# Patient Record
Sex: Female | Born: 1995 | Race: White | Hispanic: No | Marital: Single | Smoking: Never smoker
Health system: Southern US, Community
[De-identification: ages and names within clinical notes are randomized; demographics above are authoritative.]

## PROBLEM LIST (undated history)

## (undated) DIAGNOSIS — F419 Anxiety disorder, unspecified: Secondary | ICD-10-CM

## (undated) DIAGNOSIS — K219 Gastro-esophageal reflux disease without esophagitis: Secondary | ICD-10-CM

## (undated) DIAGNOSIS — Z3041 Encounter for surveillance of contraceptive pills: Secondary | ICD-10-CM

## (undated) DIAGNOSIS — F4310 Post-traumatic stress disorder, unspecified: Secondary | ICD-10-CM

## (undated) DIAGNOSIS — F329 Major depressive disorder, single episode, unspecified: Secondary | ICD-10-CM

## (undated) DIAGNOSIS — E669 Obesity, unspecified: Secondary | ICD-10-CM

## (undated) HISTORY — DX: Obesity, unspecified: E66.9

## (undated) HISTORY — PX: WISDOM TOOTH EXTRACTION: SHX21

## (undated) HISTORY — DX: Encounter for surveillance of contraceptive pills: Z30.41

---

## 2014-06-26 ENCOUNTER — Encounter: Payer: Self-pay | Admitting: Family Medicine

## 2014-06-26 ENCOUNTER — Ambulatory Visit (INDEPENDENT_AMBULATORY_CARE_PROVIDER_SITE_OTHER): Payer: BLUE CROSS/BLUE SHIELD | Admitting: Family Medicine

## 2014-06-26 VITALS — BP 122/85 | HR 74 | Temp 98.4°F | Ht 64.25 in | Wt 171.0 lb

## 2014-06-26 DIAGNOSIS — Z01419 Encounter for gynecological examination (general) (routine) without abnormal findings: Secondary | ICD-10-CM

## 2014-06-26 DIAGNOSIS — Z3041 Encounter for surveillance of contraceptive pills: Secondary | ICD-10-CM

## 2014-06-26 DIAGNOSIS — Z0000 Encounter for general adult medical examination without abnormal findings: Secondary | ICD-10-CM | POA: Diagnosis not present

## 2014-06-26 MED ORDER — NORGESTIMATE-ETH ESTRADIOL 0.25-35 MG-MCG PO TABS: 1.0000 | ORAL_TABLET | Freq: Every day | ORAL | Status: DC

## 2014-06-26 NOTE — Progress Notes (Signed)
BP 122/85 mmHg  Pulse 74  Temp(Src) 98.4 F (36.9 C)  Ht 5' 4.25" (1.632 m)  Wt 171 lb (77.565 kg)  BMI 29.12 kg/m2  SpO2 100%  LMP 06/21/2014 (Exact Date)   Subjective:    Patient ID: Brittany Johns, female    DOB: 01-10-1996, 19 y.o.   MRN: [id number]  HPI: AUNESTY TYSON is a 19 y.o. female Chief Complaint  Patient presents with  . Annual Exam   She is feeling well overall, no current complaints She would like refill of her OCP; no break through bleeding; no migraines  Relevant past medical, surgical, family and social history reviewed and updated as indicated. Interim medical history since our last visit reviewed. Allergies and medications reviewed and updated.  Review of Systems  Constitutional: Negative for fever, chills, diaphoresis, fatigue and unexpected weight change.  HENT: Negative for dental problem, mouth sores, nosebleeds, sore throat and voice change.   Eyes: Negative for visual disturbance.  Respiratory: Negative for cough, choking and chest tightness.   Cardiovascular: Negative for chest pain, palpitations and leg swelling.  Gastrointestinal: Negative for abdominal pain, diarrhea, constipation, blood in stool and abdominal distention.  Endocrine: Negative for cold intolerance, heat intolerance, polydipsia and polyuria.  Genitourinary: Negative for dysuria, vaginal discharge and dyspareunia.  Musculoskeletal: Negative for myalgias and arthralgias.  Skin: Negative for color change, rash and wound.  Allergic/Immunologic: Negative for environmental allergies and food allergies.  Neurological: Negative for seizures, numbness and headaches.  Hematological: Does not bruise/bleed easily.  Psychiatric/Behavioral: Negative for dysphoric mood. The patient is not nervous/anxious.    Per HPI unless specifically indicated above     Objective:    BP 122/85 mmHg  Pulse 74  Temp(Src) 98.4 F (36.9 C)  Ht 5' 4.25" (1.632 m)  Wt 171 lb (77.565 kg)   BMI 29.12 kg/m2  SpO2 100%  LMP 06/21/2014 (Exact Date)  Wt Readings from Last 3 Encounters:  06/26/14 171 lb (77.565 kg) (92 %*, Z = 1.41)  06/23/13 155 lb (70.308 kg) (86 %*, Z = 1.08)  06/23/13 155 lb (70.308 kg) (86 %*, Z = 1.08)   * Growth percentiles are based on CDC 2-20 Years data.    Physical Exam  Constitutional: She appears well-developed and well-nourished. No distress.  HENT:  Head: Normocephalic and atraumatic.  Right Ear: External ear normal. Tympanic membrane is not injected, not scarred and not erythematous. No middle ear effusion. No decreased hearing is noted.  Left Ear: External ear normal. Tympanic membrane is not injected, not scarred and not erythematous.  No middle ear effusion. No decreased hearing is noted.  Nose: Nose normal.  Mouth/Throat: Oropharynx is clear and moist and mucous membranes are normal. Mucous membranes are not pale and not dry. No oral lesions. No dental caries.  Eyes: EOM are normal. Right eye exhibits no discharge. Left eye exhibits no discharge. No scleral icterus.  Neck: Normal range of motion. No JVD present. No thyromegaly present.  Cardiovascular: Normal rate, regular rhythm and normal heart sounds.  Exam reveals no gallop.   No murmur heard. Pulmonary/Chest: Effort normal and breath sounds normal. No respiratory distress. She has no wheezes. She has no rales.  Abdominal: Soft. Bowel sounds are normal. She exhibits no distension and no mass. There is no tenderness.  Musculoskeletal: Normal range of motion. She exhibits no edema or tenderness.  Lymphadenopathy:    She has no cervical adenopathy.  Neurological: She is alert. She displays normal reflexes. She exhibits normal  muscle tone.  Skin: Skin is warm and dry. No rash noted. She is not diaphoretic. No erythema. No pallor.  Psychiatric: She has a normal mood and affect. Her behavior is normal. Judgment and thought content normal.  Nursing note and vitals reviewed.   No results  found for this or any previous visit.    Assessment & Plan:   Problem List Items Addressed This Visit    None    Visit Diagnoses    Well woman exam    -  Primary    healthy living, age-appropriate guidance and counseling; she refused HIV testing; tetanus UTD    Relevant Orders    Lipid Profile    Comp Met (CMET)    GC/Chlamydia Probe Amp    Oral contraceptive use        non-smoker; no migraines; she is aware of risks; refills provided; pap smear at age 10        Follow up plan: Return in about 1 year (around 06/26/2015) for physical.   Note: after labs drawn, patient became presyncopal at the check-out window; vitals monitored; drink/snack given; recovered well; will have her let lab techs know next time to be prepared for next venipuncture

## 2014-06-26 NOTE — Patient Instructions (Addendum)
Tetanus booster was received June 21, 2012 per our older records Physical activity like yoga and walking encouraged Healthy eating (3 servings of calcium daily, diet low in saturated fats, 5 servings of fruits and veggies a day, etc.)   Health Maintenance - 31-20 Years Old SCHOOL PERFORMANCE After high school, you may attend college or technical or vocational school, enroll in the [employer], or enter the workforce. PHYSICAL, SOCIAL, AND EMOTIONAL DEVELOPMENT  One hour of regular physical activity daily is recommended. Continue to participate in sports.  Develop your own interests and consider community service or volunteerism.  Make decisions about college and work plans.  Throughout these years, you should assume responsibility for your own health care. Increasing independence is important for you.  You may be exploring your sexual identity. Understand that you should never be in a situation that makes you feel uncomfortable, and tell your partner if you do not want to engage in sexual activity.  Body image may become important to you. Be mindful that eating disorders can develop at this time. Talk to your parents or other caregivers if you have concerns about body image, weight gain, or losing weight.  You may notice mood disturbances, depression, anxiety, attention problems, or trouble with alcohol. Talk to your health care provider if you have concerns about mental illness.  Set limits for yourself and talk with your parents or other caregivers about independent decision making.  Handle conflict without physical violence.  Avoid loud noises which may impair hearing.  Limit television and computer time to 2 hours each day. Individuals who engage in excessive inactivity are more likely to become overweight. RECOMMENDED IMMUNIZATIONS  Influenza vaccine.  All adults should be immunized every year.  All adults, including pregnant women and people with hives-only allergy to eggs, can  receive the inactivated influenza (IIV) vaccine.  Adults aged 18-49 years can receive the recombinant influenza (RIV) vaccine. The RIV vaccine does not contain any egg protein.  Tetanus, diphtheria, and acellular pertussis (Td, Tdap) vaccine.  Pregnant women should receive 1 dose of Tdap vaccine during each pregnancy. The dose should be obtained regardless of the length of time since the last dose. Immunization is preferred during the 27th to 36th week of gestation.  An adult who has not previously received Tdap or who does not know his or her vaccine status should receive 1 dose of Tdap. This initial dose should be followed by tetanus and diphtheria toxoids (Td) booster doses every 10 years.  Adults with an unknown or incomplete history of completing a 3-dose immunization series with Td-containing vaccines should begin or complete a primary immunization series including a Tdap dose.  Adults should receive a Td booster every 10 years.  Varicella vaccine.  An adult without evidence of immunity to varicella should receive 2 doses or a second dose if he or she has previously received 1 dose.  Pregnant females who do not have evidence of immunity should receive the first dose after pregnancy. This first dose should be obtained before leaving the health care facility. The second dose should be obtained 4-8 weeks after the first dose.  Human papillomavirus (HPV) vaccine.  Females aged 13-26 years who have not received the vaccine previously should obtain the 3-dose series.  The vaccine is not recommended for pregnant females. However, pregnancy testing is not needed before receiving a dose. If a female is found to be pregnant after receiving a dose, no treatment is needed. In that case, the remaining doses should be  delayed until after the pregnancy.  Males aged 69-21 years who have not received the vaccine previously should receive the 3-dose series. Males aged 22-26 years may be  immunized.  Immunization is recommended through the age of 29 years for any female who has sex with males and did not get any or all doses earlier.  Immunization is recommended for any person with an immunocompromised condition through the age of 46 years if he or she did not get any or all doses earlier.  During the 3-dose series, the second dose should be obtained 4-8 weeks after the first dose. The third dose should be obtained 24 weeks after the first dose and 16 weeks after the second dose.  Measles, mumps, and rubella (MMR) vaccine.  Adults born in [birth year] or later should have 1 or more doses of MMR vaccine unless there is a contraindication to the vaccine or there is laboratory evidence of immunity to each of the three diseases.  A routine second dose of MMR vaccine should be obtained at least 28 days after the first dose for students attending postsecondary schools, health care workers, and international travelers.  For females of childbearing age, rubella immunity should be determined. If there is no evidence of immunity, females who are not pregnant should be vaccinated. If there is no evidence of immunity, females who are pregnant should delay immunization until after pregnancy.  Pneumococcal 13-valent conjugate (PCV13) vaccine.  When indicated, a person who is uncertain of his or her immunization history and has no record of immunization should receive the PCV13 vaccine.  An adult aged 52 years or older who has certain medical conditions and has not been previously immunized should receive 1 dose of PCV13 vaccine. This PCV13 should be followed with a dose of pneumococcal polysaccharide (PPSV23) vaccine. The PPSV23 vaccine dose should be obtained at least 8 weeks after the dose of PCV13 vaccine.  An adult aged 24 years or older who has certain medical conditions and previously received 1 or more doses of PPSV23 vaccine should receive 1 dose of PCV13. The PCV13 vaccine dose should be  obtained 1 or more years after the last PPSV23 vaccine dose.  Pneumococcal polysaccharide (PPSV23) vaccine.  When PCV13 is also indicated, PCV13 should be obtained first.  An adult younger than age 66 years who has certain medical conditions should be immunized.  Any person who resides in a long-term care facility should be immunized.  An adult smoker should be immunized.  People with an immunocompromised condition and certain other conditions should receive both PCV13 and PPSV23 vaccines.  People with human immunodeficiency virus (HIV) infection should be immunized as soon as possible after diagnosis.  Immunization during chemotherapy or radiation therapy should be avoided.  Routine use of PPSV23 vaccine is not recommended for American Indians, [place], or people younger than 65 years unless there are medical conditions that require PPSV23 vaccine.  When indicated, people who have unknown immunization and have no record of immunization should receive PPSV23 vaccine.  One-time revaccination 5 years after the first dose of PPSV23 is recommended for people aged 19-64 years who have chronic kidney failure, nephrotic syndrome, asplenia, or immunocompromised conditions.  Meningococcal vaccine.  Adults with asplenia or persistent complement component deficiencies should receive 2 doses of quadrivalent meningococcal conjugate (MenACWY-D) vaccine. The doses should be obtained at least 2 months apart.  Microbiologists working with certain meningococcal bacteria, [place] recruits, people at risk during an outbreak, and people who travel to or live in  countries with a high rate of meningitis should be immunized.  A first-year college student up through age 35 years who is living in a residence hall should receive a dose if he or she did not receive a dose on or after his or her 16th birthday.  Adults who have certain high-risk conditions should receive one or more doses of  vaccine.  Hepatitis A vaccine.  Adults who wish to be protected from this disease, have certain high-risk conditions, work with hepatitis A-infected animals, work in hepatitis A research labs, or travel to or work in countries with a high rate of hepatitis A should be immunized.  Adults who were previously unvaccinated and who anticipate close contact with an international adoptee during the first 60 days after arrival in the [place] from a country with a high rate of hepatitis A should be immunized.  Hepatitis B vaccine.  Adults who wish to be protected from this disease, have certain high-risk conditions, may be exposed to blood or other infectious body fluids, are household contacts or sex partners of hepatitis B positive people, are clients or workers in certain care facilities, or travel to or work in countries with a high rate of hepatitis B should be immunized.  Haemophilus influenzae type b (Hib) vaccine.  A previously unvaccinated person with asplenia or sickle cell disease or having a scheduled splenectomy should receive 1 dose of Hib vaccine.  Regardless of previous immunization, a recipient of a hematopoietic stem cell transplant should receive a 3-dose series 6-12 months after his or her successful transplant.  Hib vaccine is not recommended for adults with HIV infection. TESTING  Annual screening for vision and hearing problems is recommended. Vision should be screened at least once between 62-31 years of age.  You may be screened for anemia or tuberculosis.  You should have a blood test to check for high cholesterol.  You should be screened for alcohol and drug use.  If you are sexually active, you may be screened for sexually transmitted infections (STIs), pregnancy, or HIV. You should be screened for STIs if:  Your sexual activity has changed since the last screening test, and you are at an increased risk for chlamydia or gonorrhea. Ask your health care provider  if you are at risk.  If you are at an increased risk for hepatitis B, you should be screened for this virus. You are considered at high risk for hepatitis B if you:  Were born in a country where hepatitis B occurs often. Talk with your health care provider about which countries are considered high risk.  Have parents who were born in a high-risk country and have not received a shot to protect against hepatitis B (hepatitis B vaccine).  Have HIV or AIDS.  Use needles to inject street drugs.  Live with or have sex with someone who has hepatitis B.  Are a man who has sex with other men (MSM).  Get hemodialysis treatment.  Take certain medicines for conditions like cancer, organ transplantation, or autoimmune conditions. NUTRITION   You should:  Have three servings of low-fat milk and dairy products daily. If you do not drink milk or consume dairy products, you should eat calcium-enriched foods, such as juice, bread, or cereal. Dark, leafy greens or canned fish are alternate sources of calcium.  Drink plenty of water. Fruit juice should be limited to 8-12 oz (240-360 mL) each day. Sugary beverages and sodas should be avoided.  Avoid eating foods high in fat,  salt, or sugar, such as chips, candy, and cookies.  Avoid fast foods and limit eating out at restaurants.  Try not to skip meals, especially breakfast. You should eat a variety of vegetables, fruits, and lean meats.  Eat meals together as a family whenever possible. ORAL HEALTH Brush your teeth twice a day and floss at least once a day. You should have two dental exams a year.  SKIN CARE You should wear sunscreen when out in the sun. TALK TO SOMEONE ABOUT:  Precautions against pregnancy, contraception, and sexually transmitted infections.  Taking a prescription medicine daily to prevent HIV infection if you are at risk of being infected with HIV. This is called preexposure prophylaxis (PrEP). You are at risk if you:  Are a  female who has sex with other males (MSM).  Are heterosexual and sexually active with more than one partner.  Take drugs by injection.  Are sexually active with a partner who has HIV.  Whether you are at high risk of being infected with HIV. If you choose to begin PrEP, you should first be tested for HIV. You should then be tested every 3 months for as long as you are taking PrEP.  Drug, tobacco, and alcohol use among your friends or at friends' homes. Smoking tobacco or marijuana and taking drugs have health consequences and may impact your brain development.  Appropriate use of over-the-counter or prescription medicines.  Driving guidelines and riding with friends.  The risks of drinking and driving or boating. Call someone if you have been drinking or using drugs and need a ride. WHAT'S NEXT? Visit your pediatrician or family physician once a year. By young adulthood, you should transition from your pediatrician to a family physician or internal medicine specialist. If you are a female and are sexually active, you may want to begin annual physical exams with a gynecologist. Document Released: 03/30/2006 Document Revised: 01/07/2013 Document Reviewed: 04/19/2006 [facility] Patient Information 2015 [place], [place]. This information is not intended to replace advice given to you by your health care provider. Make sure you discuss any questions you have with your health care provider.

## 2014-06-27 LAB — COMPREHENSIVE METABOLIC PANEL
ALT: 8 IU/L (ref 0–32)
AST: 12 IU/L (ref 0–40)
Albumin/Globulin Ratio: 1.8 (ref 1.1–2.5)
Albumin: 4.8 g/dL (ref 3.5–5.5)
Alkaline Phosphatase: 48 IU/L (ref 39–117)
BUN/Creatinine Ratio: 14 (ref 8–20)
BUN: 11 mg/dL (ref 6–20)
Bilirubin Total: 0.5 mg/dL (ref 0.0–1.2)
CALCIUM: 10 mg/dL (ref 8.7–10.2)
CHLORIDE: 99 mmol/L (ref 97–108)
CO2: 24 mmol/L (ref 18–29)
Creatinine, Ser: 0.81 mg/dL (ref 0.57–1.00)
GFR calc Af Amer: 122 mL/min/{1.73_m2} (ref >59)
GFR, EST NON AFRICAN AMERICAN: 106 mL/min/{1.73_m2} (ref >59)
Globulin, Total: 2.6 g/dL (ref 1.5–4.5)
Glucose: 82 mg/dL (ref 65–99)
Potassium: 4.5 mmol/L (ref 3.5–5.2)
SODIUM: 139 mmol/L (ref 134–144)
Total Protein: 7.4 g/dL (ref 6.0–8.5)

## 2014-06-27 LAB — LIPID PANEL
CHOL/HDL RATIO: 3 ratio (ref 0.0–4.4)
CHOLESTEROL TOTAL: 179 mg/dL — AB (ref 100–169)
HDL: 59 mg/dL (ref >39)
LDL Calculated: 109 mg/dL (ref 0–109)
Triglycerides: 55 mg/dL (ref 0–89)
VLDL Cholesterol Cal: 11 mg/dL (ref 5–40)

## 2014-06-29 ENCOUNTER — Encounter: Payer: Self-pay | Admitting: Family Medicine

## 2014-06-29 LAB — GC/CHLAMYDIA PROBE AMP
Chlamydia trachomatis, NAA: NEGATIVE
NEISSERIA GONORRHOEAE BY PCR: NEGATIVE

## 2015-01-07 ENCOUNTER — Encounter: Payer: Self-pay | Admitting: Family Medicine

## 2015-01-07 ENCOUNTER — Ambulatory Visit (INDEPENDENT_AMBULATORY_CARE_PROVIDER_SITE_OTHER): Payer: BLUE CROSS/BLUE SHIELD | Admitting: Family Medicine

## 2015-01-07 VITALS — BP 106/72 | HR 82 | Resp 16 | Wt 173.4 lb

## 2015-01-07 DIAGNOSIS — T7840XA Allergy, unspecified, initial encounter: Secondary | ICD-10-CM

## 2015-01-07 MED ORDER — DIPHENHYDRAMINE HCL 25 MG PO CAPS: 25.0000 mg | ORAL_CAPSULE | Freq: Four times a day (QID) | ORAL | Status: DC | PRN

## 2015-01-07 NOTE — Progress Notes (Signed)
Name: Brittany BuddCatherine M Johns   MRN: [id number]    DOB: 10/19/1995   Date:01/07/2015       Progress Note  Subjective  Chief Complaint  Chief Complaint  Patient presents with  . Rash    Taking Hydrocodone post dental work. Rash started 3 days ago.     HPI Had 4 wisdom teeth removed 12/31/14.  Took Amoxil until yesterday.  Still taking Norco.  Rash started 2 days ago.  Mild.  Has gotten very bad past 12 hrs.  Itching pretty bad.  No treatment.  No problem-specific assessment & plan notes found for this encounter.   History reviewed. No pertinent past medical history.  Social History  Substance Use Topics  . Smoking status: Never Smoker   . Smokeless tobacco: Never Used  . Alcohol Use: No     Current outpatient prescriptions:  .  chlorhexidine (PERIDEX) 0.12 % solution, , Disp: , Rfl: 98 .  HYDROcodone-acetaminophen (NORCO) 7.5-325 MG tablet, , Disp: , Rfl: 0 .  ibuprofen (ADVIL,MOTRIN) 600 MG tablet, , Disp: , Rfl: 0 .  norgestimate-ethinyl estradiol (ORTHO-CYCLEN,SPRINTEC,PREVIFEM) 0.25-35 MG-MCG tablet, Take 1 tablet by mouth daily., Disp: 1 Package, Rfl: 12  No Known Allergies  Review of Systems  Constitutional: Negative for fever, chills, weight loss and malaise/fatigue.  HENT: Negative for hearing loss.   Eyes: Negative for blurred vision and double vision.  Respiratory: Negative for cough, shortness of breath and wheezing.   Cardiovascular: Negative for chest pain, palpitations and leg swelling.  Gastrointestinal: Negative for heartburn, abdominal pain and blood in stool.  Genitourinary: Negative for dysuria, urgency and frequency.  Skin: Positive for itching and rash.  Neurological: Negative for dizziness, tremors, weakness and headaches.      Objective  Filed Vitals:   01/07/15 1541  BP: 106/72  Pulse: 82  Resp: 16  Weight: 173 lb 6.4 oz (78.654 kg)     Physical Exam  Constitutional: She is oriented to person, place, and time and well-developed,  well-nourished, and in no distress. No distress.  HENT:  Head: Normocephalic and atraumatic.  Cardiovascular: Normal rate, regular rhythm and normal heart sounds.  Exam reveals no gallop and no friction rub.   No murmur heard. Pulmonary/Chest: Effort normal and breath sounds normal. No respiratory distress. She has no wheezes. She has no rales.  Neurological: She is alert and oriented to person, place, and time.  Skin:  Maculo -papular, urticarial rash diffusely over body.  None in  mouth      No results found for this or any previous visit (from the past 2160 hour(s)).   Assessment & Plan  1. Allergic reaction, initial encounter -Take Benadryl  25 mg. Up to 4 times a day.

## 2015-06-17 ENCOUNTER — Other Ambulatory Visit: Payer: Self-pay | Admitting: Family Medicine

## 2015-06-23 ENCOUNTER — Other Ambulatory Visit: Payer: Self-pay | Admitting: Family Medicine

## 2015-06-23 NOTE — Telephone Encounter (Signed)
rx approved Upcoming appt next week

## 2015-06-28 ENCOUNTER — Encounter: Payer: BLUE CROSS/BLUE SHIELD | Admitting: Family Medicine

## 2015-07-13 ENCOUNTER — Encounter: Payer: Self-pay | Admitting: Family Medicine

## 2015-07-13 ENCOUNTER — Ambulatory Visit (INDEPENDENT_AMBULATORY_CARE_PROVIDER_SITE_OTHER): Payer: BLUE CROSS/BLUE SHIELD | Admitting: Family Medicine

## 2015-07-13 VITALS — BP 114/72 | HR 95 | Temp 98.7°F | Resp 16 | Wt 182.0 lb

## 2015-07-13 DIAGNOSIS — Z3041 Encounter for surveillance of contraceptive pills: Secondary | ICD-10-CM | POA: Diagnosis not present

## 2015-07-13 DIAGNOSIS — Z0000 Encounter for general adult medical examination without abnormal findings: Secondary | ICD-10-CM | POA: Diagnosis not present

## 2015-07-13 DIAGNOSIS — E669 Obesity, unspecified: Secondary | ICD-10-CM | POA: Diagnosis not present

## 2015-07-13 HISTORY — DX: Obesity, unspecified: E66.9

## 2015-07-13 HISTORY — DX: Encounter for surveillance of contraceptive pills: Z30.41

## 2015-07-13 MED ORDER — NORGESTIMATE-ETH ESTRADIOL 0.25-35 MG-MCG PO TABS: 1.0000 | ORAL_TABLET | Freq: Every day | ORAL | Status: DC

## 2015-07-13 NOTE — Progress Notes (Signed)
Patient ID: Brittany Johns, female   DOB: 05/11/1995, 20 y.o.   MRN: [id number]   Subjective:   Brittany Johns is a 20 y.o. female here for a complete physical exam  Interim issues since last visit: allergic to penicillin, had teeth pulled and got a diffuse rash  USPSTF grade A and B recommendations Alcohol: no Depression: Depression screen [facility] 2/9 07/13/2015  Decreased Interest 0  Down, Depressed, Hopeless 0  PHQ - 2 Score 0    Hypertension: well-controlled, does not run in the family Obesity: wondering about her thyroid, not as active, weight gain doesn't make sense Tobacco use: no HIV: declined STD testing and prevention (chl/gon/syphilis): decline Lipids: check another day Glucose: check another day Colorectal cancer: no colon cancer Breast cancer: no fam hx BRCA gene screening: no fam hx of breast or ovarian cancer Intimate partner violence: no Cervical cancer screening: start at 76 Diet: tries to eat pretty good Exercise: try to become active Skin cancer: no worrisome moles  On OCP; happy with those; using for contraception and regulating period; no migraines with aura; no DVT  Past Medical History  Diagnosis Date  . Encounter for birth control pills maintenance 07/13/2015  . Obesity 07/13/2015     Past Surgical History  Procedure Laterality Date  . Wisdom tooth extraction     Family History  Problem Relation Age of Onset  . Diabetes Maternal Grandmother     BORDERLINE  . Heart disease Maternal Grandfather   . Heart disease Paternal Grandfather    Social History  Substance Use Topics  . Smoking status: Never Smoker   . Smokeless tobacco: Never Used  . Alcohol Use: No   Review of Systems  Constitutional: Positive for unexpected weight change.  Eyes: Negative for visual disturbance.  Respiratory: Negative for wheezing.   Cardiovascular: Negative for chest pain.  Gastrointestinal: Negative for blood in stool.  Genitourinary: Negative for  hematuria and pelvic pain.  Hematological: Negative for adenopathy. Does not bruise/bleed easily.  Psychiatric/Behavioral: Negative for dysphoric mood.    Objective:   Filed Vitals:   07/13/15 1121  BP: 114/72  Pulse: 95  Temp: 98.7 F (37.1 C)  TempSrc: Oral  Resp: 16  Weight: 182 lb (82.555 kg)  SpO2: 98%   Body mass index is 31 kg/(m^2). Wt Readings from Last 3 Encounters:  07/13/15 182 lb (82.555 kg)  01/07/15 173 lb 6.4 oz (78.654 kg)  06/26/14 171 lb (77.565 kg) (92 %*, Z = 1.41)   * Growth percentiles are based on CDC 2-20 Years data.   Physical Exam  Constitutional: She appears well-developed and well-nourished.  HENT:  Head: Normocephalic and atraumatic.  Right Ear: Hearing, tympanic membrane, external ear and ear canal normal.  Left Ear: Hearing, tympanic membrane, external ear and ear canal normal.  Eyes: Conjunctivae and EOM are normal. Right eye exhibits no hordeolum. Left eye exhibits no hordeolum. No scleral icterus.  Neck: Carotid bruit is not present. No thyromegaly present.  Cardiovascular: Normal rate, regular rhythm, S1 normal, S2 normal and normal heart sounds.   No extrasystoles are present.  Pulmonary/Chest: Effort normal and breath sounds normal. No respiratory distress.  Abdominal: Soft. Normal appearance and bowel sounds are normal. She exhibits no distension, no abdominal bruit, no pulsatile midline mass and no mass. There is no hepatosplenomegaly. There is no tenderness. No hernia.  Musculoskeletal: Normal range of motion. She exhibits no edema.  Lymphadenopathy:       Head (right side): No submandibular adenopathy  present.       Head (left side): No submandibular adenopathy present.    She has no cervical adenopathy.  Neurological: She is alert. She displays no tremor. No cranial nerve deficit. She exhibits normal muscle tone. Gait normal.  Reflex Scores:      Patellar reflexes are 2+ on the right side and 2+ on the left side. Skin: Skin is  warm and dry. No bruising and no ecchymosis noted. No cyanosis. No pallor.  Psychiatric: Her speech is normal and behavior is normal. Thought content normal. Her mood appears not anxious. She does not exhibit a depressed mood.   Assessment/Plan:   Problem List Items Addressed This Visit      Other   Preventative health care - Primary    USPSTF grade A and B recommendations reviewed with patient; age-appropriate recommendations, preventive care, screening tests, etc discussed and encouraged; healthy living encouraged; see AVS for patient education given to patient      Relevant Orders   COMPLETE METABOLIC PANEL WITH GFR   TSH   Lipid panel   CBC with Differential/Platelet   Obesity    Encouraged healthy eating, more activity; see AVS for recommendations; will check TSH to ensure not underactive      Encounter for birth control pills maintenance    Denies smoking, denies migraine with aura, denies DVT; BP controlled; okay to continue OCPs         Meds ordered this encounter  Medications  . norgestimate-ethinyl estradiol (SPRINTEC 28) 0.25-35 MG-MCG tablet    Sig: Take 1 tablet by mouth daily.    Dispense:  1 Package    Refill:  12   Orders Placed This Encounter  Procedures  . COMPLETE METABOLIC PANEL WITH GFR    Standing Status: Future     Number of Occurrences:      Standing Expiration Date: 10/16/2015  . TSH    Standing Status: Future     Number of Occurrences:      Standing Expiration Date: 10/16/2015  . Lipid panel    Standing Status: Future     Number of Occurrences:      Standing Expiration Date: 10/16/2015    Order Specific Question:  Has the patient fasted?    Answer:  Yes  . CBC with Differential/Platelet    Standing Status: Future     Number of Occurrences:      Standing Expiration Date: 10/16/2015   Follow up plan: Return in about 1 year (around 07/12/2016) for complete physical. An after-visit summary was printed and given to the patient at [facility].   Please see the patient instructions which may contain other information and recommendations beyond what is mentioned above in the assessment and plan.

## 2015-07-13 NOTE — Patient Instructions (Addendum)
Please return for fasting labs; okay to drink calorie-free liquids, but no calories after midnight the night before your come  Check out the information at familydoctor.org entitled "Nutrition for Weight Loss: What You Need to Know about Fad Diets" Try to lose between 1-2 pounds per week by taking in fewer calories and burning off more calories You can succeed by limiting portions, limiting foods dense in calories and fat, becoming more active, and drinking 8 glasses of water a day (64 ounces) Don't skip meals, especially breakfast, as skipping meals may alter your metabolism Do not use over-the-counter weight loss pills or gimmicks that claim rapid weight loss A healthy BMI (or body mass index) is between 18.5 and 24.9 You can calculate your ideal BMI at the [place] website [employer]fr   Health Maintenance, Female Adopting a healthy lifestyle and getting preventive care can go a long way to promote health and wellness. Talk with your health care provider about what schedule of regular examinations is right for you. This is a good chance for you to check in with your provider about disease prevention and staying healthy. In between checkups, there are plenty of things you can do on your own. Experts have done a lot of research about which lifestyle changes and preventive measures are most likely to keep you healthy. Ask your health care provider for more information. WEIGHT AND DIET  Eat a healthy diet  Be sure to include plenty of vegetables, fruits, low-fat dairy products, and lean protein.  Do not eat a lot of foods high in solid fats, added sugars, or salt.  Get regular exercise. This is one of the most important things you can do for your health.  Most adults should exercise for at least 150 minutes each week. The exercise should increase your heart rate and make you sweat (moderate-intensity exercise).  Most adults should also do  strengthening exercises at least twice a week. This is in addition to the moderate-intensity exercise.  Maintain a healthy weight  Body mass index (BMI) is a measurement that can be used to identify possible weight problems. It estimates body fat based on height and weight. Your health care provider can help determine your BMI and help you achieve or maintain a healthy weight.  For females 38 years of age and older:   A BMI below 18.5 is considered underweight.  A BMI of 18.5 to 24.9 is normal.  A BMI of 25 to 29.9 is considered overweight.  A BMI of 30 and above is considered obese.  Watch levels of cholesterol and blood lipids  You should start having your blood tested for lipids and cholesterol at 21 years of age, then have this test every 5 years.  You may need to have your cholesterol levels checked more often if:  Your lipid or cholesterol levels are high.  You are older than 21 years of age.  You are at high risk for heart disease.  CANCER SCREENING   Lung Cancer  Lung cancer screening is recommended for adults 57-1 years old who are at high risk for lung cancer because of a history of smoking.  A yearly low-dose CT scan of the lungs is recommended for people who:  Currently smoke.  Have quit within the past 15 years.  Have at least a 30-pack-year history of smoking. A pack year is smoking an average of one pack of cigarettes a day for 1 year.  Yearly screening should continue until it has been 15 years since  you quit.  Yearly screening should stop if you develop a health problem that would prevent you from having lung cancer treatment.  Breast Cancer  Practice breast self-awareness. This means understanding how your breasts normally appear and feel.  It also means doing regular breast self-exams. Let your health care provider know about any changes, no matter how small.  If you are in your 20s or 30s, you should have a clinical breast exam (CBE) by a  health care provider every 1-3 years as part of a regular health exam.  If you are 64 or older, have a CBE every year. Also consider having a breast X-ray (mammogram) every year.  If you have a family history of breast cancer, talk to your health care provider about genetic screening.  If you are at high risk for breast cancer, talk to your health care provider about having an MRI and a mammogram every year.  Breast cancer gene (BRCA) assessment is recommended for women who have family members with BRCA-related cancers. BRCA-related cancers include:  Breast.  Ovarian.  Tubal.  Peritoneal cancers.  Results of the assessment will determine the need for genetic counseling and BRCA1 and BRCA2 testing. Cervical Cancer Your health care provider may recommend that you be screened regularly for cancer of the pelvic organs (ovaries, uterus, and vagina). This screening involves a pelvic examination, including checking for microscopic changes to the surface of your cervix (Pap test). You may be encouraged to have this screening done every 3 years, beginning at age 51.  For women ages 13-65, health care providers may recommend pelvic exams and Pap testing every 3 years, or they may recommend the Pap and pelvic exam, combined with testing for human papilloma virus (HPV), every 5 years. Some types of HPV increase your risk of cervical cancer. Testing for HPV may also be done on women of any age with unclear Pap test results.  Other health care providers may not recommend any screening for nonpregnant women who are considered low risk for pelvic cancer and who do not have symptoms. Ask your health care provider if a screening pelvic exam is right for you.  If you have had past treatment for cervical cancer or a condition that could lead to cancer, you need Pap tests and screening for cancer for at least 20 years after your treatment. If Pap tests have been discontinued, your risk factors (such as having a  new sexual partner) need to be reassessed to determine if screening should resume. Some women have medical problems that increase the chance of getting cervical cancer. In these cases, your health care provider may recommend more frequent screening and Pap tests. Colorectal Cancer  This type of cancer can be detected and often prevented.  Routine colorectal cancer screening usually begins at 21 years of age and continues through 21 years of age.  Your health care provider may recommend screening at an earlier age if you have risk factors for colon cancer.  Your health care provider may also recommend using home test kits to check for hidden blood in the stool.  A small camera at the end of a tube can be used to examine your colon directly (sigmoidoscopy or colonoscopy). This is done to check for the earliest forms of colorectal cancer.  Routine screening usually begins at age 58.  Direct examination of the colon should be repeated every 5-10 years through 21 years of age. However, you may need to be screened more often if early forms of  precancerous polyps or small growths are found. Skin Cancer  Check your skin from head to toe regularly.  Tell your health care provider about any new moles or changes in moles, especially if there is a change in a mole's shape or color.  Also tell your health care provider if you have a mole that is larger than the size of a pencil eraser.  Always use sunscreen. Apply sunscreen liberally and repeatedly throughout the day.  Protect yourself by wearing long sleeves, pants, a wide-brimmed hat, and sunglasses whenever you are outside. HEART DISEASE, DIABETES, AND HIGH BLOOD PRESSURE   High blood pressure causes heart disease and increases the risk of stroke. High blood pressure is more likely to develop in:  People who have blood pressure in the high end of the normal range (130-139/85-89 mm Hg).  People who are overweight or obese.  People who are  African American.  If you are 23-85 years of age, have your blood pressure checked every 3-5 years. If you are 42 years of age or older, have your blood pressure checked every year. You should have your blood pressure measured twice--once when you are at a hospital or clinic, and once when you are not at a hospital or clinic. Record the average of the two measurements. To check your blood pressure when you are not at a hospital or clinic, you can use:  An automated blood pressure machine at a pharmacy.  A home blood pressure monitor.  If you are between 34 years and 84 years old, ask your health care provider if you should take aspirin to prevent strokes.  Have regular diabetes screenings. This involves taking a blood sample to check your fasting blood sugar level.  If you are at a normal weight and have a low risk for diabetes, have this test once every three years after 21 years of age.  If you are overweight and have a high risk for diabetes, consider being tested at a younger age or more often. PREVENTING INFECTION  Hepatitis B  If you have a higher risk for hepatitis B, you should be screened for this virus. You are considered at high risk for hepatitis B if:  You were born in a country where hepatitis B is common. Ask your health care provider which countries are considered high risk.  Your parents were born in a high-risk country, and you have not been immunized against hepatitis B (hepatitis B vaccine).  You have HIV or AIDS.  You use needles to inject street drugs.  You live with someone who has hepatitis B.  You have had sex with someone who has hepatitis B.  You get hemodialysis treatment.  You take certain medicines for conditions, including cancer, organ transplantation, and autoimmune conditions. Hepatitis C  Blood testing is recommended for:  Everyone born from [birth year] through 1966.  Anyone with known risk factors for hepatitis C. Sexually transmitted infections  (STIs)  You should be screened for sexually transmitted infections (STIs) including gonorrhea and chlamydia if:  You are sexually active and are younger than 21 years of age.  You are older than 21 years of age and your health care provider tells you that you are at risk for this type of infection.  Your sexual activity has changed since you were last screened and you are at an increased risk for chlamydia or gonorrhea. Ask your health care provider if you are at risk.  If you do not have HIV, but are at risk,  it may be recommended that you take a prescription medicine daily to prevent HIV infection. This is called pre-exposure prophylaxis (PrEP). You are considered at risk if:  You are sexually active and do not regularly use condoms or know the HIV status of your partner(s).  You take drugs by injection.  You are sexually active with a partner who has HIV. Talk with your health care provider about whether you are at high risk of being infected with HIV. If you choose to begin PrEP, you should first be tested for HIV. You should then be tested every 3 months for as long as you are taking PrEP.  PREGNANCY   If you are premenopausal and you may become pregnant, ask your health care provider about preconception counseling.  If you may become pregnant, take 400 to 800 micrograms (mcg) of folic acid every day.  If you want to prevent pregnancy, talk to your health care provider about birth control (contraception). OSTEOPOROSIS AND MENOPAUSE   Osteoporosis is a disease in which the bones lose minerals and strength with aging. This can result in serious bone fractures. Your risk for osteoporosis can be identified using a bone density scan.  If you are 49 years of age or older, or if you are at risk for osteoporosis and fractures, ask your health care provider if you should be screened.  Ask your health care provider whether you should take a calcium or vitamin D supplement to lower your risk  for osteoporosis.  Menopause may have certain physical symptoms and risks.  Hormone replacement therapy may reduce some of these symptoms and risks. Talk to your health care provider about whether hormone replacement therapy is right for you.  HOME CARE INSTRUCTIONS   Schedule regular health, dental, and eye exams.  Stay current with your immunizations.   Do not use any tobacco products including cigarettes, chewing tobacco, or electronic cigarettes.  If you are pregnant, do not drink alcohol.  If you are breastfeeding, limit how much and how often you drink alcohol.  Limit alcohol intake to no more than 1 drink per day for nonpregnant women. One drink equals 12 ounces of beer, 5 ounces of wine, or 1 ounces of hard liquor.  Do not use street drugs.  Do not share needles.  Ask your health care provider for help if you need support or information about quitting drugs.  Tell your health care provider if you often feel depressed.  Tell your health care provider if you have ever been abused or do not feel safe at home.   This information is not intended to replace advice given to you by your health care provider. Make sure you discuss any questions you have with your health care provider.   Document Released: 07/18/2010 Document Revised: 01/23/2014 Document Reviewed: 12/04/2012 Elsevier Interactive Patient Education [employer].

## 2015-07-13 NOTE — Assessment & Plan Note (Signed)
Denies smoking, denies migraine with aura, denies DVT; BP controlled; okay to continue OCPs

## 2015-07-13 NOTE — Assessment & Plan Note (Signed)
Encouraged healthy eating, more activity; see AVS for recommendations; will check TSH to ensure not underactive

## 2015-07-13 NOTE — Assessment & Plan Note (Signed)
USPSTF grade A and B recommendations reviewed with patient; age-appropriate recommendations, preventive care, screening tests, etc discussed and encouraged; healthy living encouraged; see AVS for patient education given to patient  

## 2015-07-14 ENCOUNTER — Other Ambulatory Visit: Payer: Self-pay | Admitting: Family Medicine

## 2015-07-14 LAB — COMPLETE METABOLIC PANEL WITH GFR
ALBUMIN: 4.5 g/dL (ref 3.6–5.1)
ALT: 21 U/L (ref 6–29)
AST: 54 U/L — AB (ref 10–30)
Alkaline Phosphatase: 41 U/L (ref 33–115)
BILIRUBIN TOTAL: 0.4 mg/dL (ref 0.2–1.2)
BUN: 12 mg/dL (ref 7–25)
CO2: 22 mmol/L (ref 20–31)
Calcium: 9.5 mg/dL (ref 8.6–10.2)
Chloride: 104 mmol/L (ref 98–110)
Creat: 0.81 mg/dL (ref 0.50–1.10)
GFR, Est African American: >89 mL/min (ref >=60)
GFR, Est Non African American: >89 mL/min (ref >=60)
Glucose, Bld: 95 mg/dL (ref 65–99)
POTASSIUM: 4.4 mmol/L (ref 3.5–5.3)
SODIUM: 138 mmol/L (ref 135–146)
TOTAL PROTEIN: 7 g/dL (ref 6.1–8.1)

## 2015-07-14 LAB — LIPID PANEL
CHOLESTEROL: 161 mg/dL (ref 125–170)
HDL: 63 mg/dL (ref >=46)
LDL CALC: 84 mg/dL (ref <110)
Total CHOL/HDL Ratio: 2.6 Ratio (ref <=5.0)
Triglycerides: 72 mg/dL (ref <150)
VLDL: 14 mg/dL (ref <30)

## 2015-07-14 LAB — TSH: TSH: 3.06 mIU/L

## 2015-07-15 ENCOUNTER — Other Ambulatory Visit: Payer: Self-pay | Admitting: Family Medicine

## 2015-07-15 DIAGNOSIS — R740 Nonspecific elevation of levels of transaminase and lactic acid dehydrogenase [LDH]: Principal | ICD-10-CM

## 2015-07-15 DIAGNOSIS — R7401 Elevation of levels of liver transaminase levels: Secondary | ICD-10-CM

## 2015-07-30 ENCOUNTER — Other Ambulatory Visit: Payer: Self-pay | Admitting: Family Medicine

## 2015-07-30 LAB — HEPATIC FUNCTION PANEL
ALT: 10 U/L (ref 6–29)
AST: 12 U/L (ref 10–30)
Albumin: 4.2 g/dL (ref 3.6–5.1)
Alkaline Phosphatase: 43 U/L (ref 33–115)
BILIRUBIN INDIRECT: 0.3 mg/dL (ref 0.2–1.2)
Bilirubin, Direct: 0.1 mg/dL (ref <=0.2)
TOTAL PROTEIN: 7.1 g/dL (ref 6.1–8.1)
Total Bilirubin: 0.4 mg/dL (ref 0.2–1.2)

## 2016-07-18 ENCOUNTER — Ambulatory Visit (INDEPENDENT_AMBULATORY_CARE_PROVIDER_SITE_OTHER): Payer: BLUE CROSS/BLUE SHIELD | Admitting: Family Medicine

## 2016-07-18 ENCOUNTER — Encounter: Payer: Self-pay | Admitting: Family Medicine

## 2016-07-18 ENCOUNTER — Other Ambulatory Visit: Payer: Self-pay | Admitting: Family Medicine

## 2016-07-18 VITALS — BP 126/72 | HR 98 | Temp 98.3°F | Resp 14 | Ht 64.0 in | Wt 192.3 lb

## 2016-07-18 DIAGNOSIS — K219 Gastro-esophageal reflux disease without esophagitis: Secondary | ICD-10-CM

## 2016-07-18 DIAGNOSIS — Z124 Encounter for screening for malignant neoplasm of cervix: Secondary | ICD-10-CM | POA: Insufficient documentation

## 2016-07-18 DIAGNOSIS — Z0001 Encounter for general adult medical examination with abnormal findings: Secondary | ICD-10-CM

## 2016-07-18 DIAGNOSIS — W57XXXA Bitten or stung by nonvenomous insect and other nonvenomous arthropods, initial encounter: Secondary | ICD-10-CM | POA: Diagnosis not present

## 2016-07-18 DIAGNOSIS — Z6833 Body mass index (BMI) 33.0-33.9, adult: Secondary | ICD-10-CM | POA: Diagnosis not present

## 2016-07-18 DIAGNOSIS — Z3041 Encounter for surveillance of contraceptive pills: Secondary | ICD-10-CM | POA: Diagnosis not present

## 2016-07-18 DIAGNOSIS — N898 Other specified noninflammatory disorders of vagina: Secondary | ICD-10-CM

## 2016-07-18 DIAGNOSIS — E6609 Other obesity due to excess calories: Secondary | ICD-10-CM

## 2016-07-18 DIAGNOSIS — Z113 Encounter for screening for infections with a predominantly sexual mode of transmission: Secondary | ICD-10-CM | POA: Diagnosis not present

## 2016-07-18 DIAGNOSIS — Z0000 Encounter for general adult medical examination without abnormal findings: Secondary | ICD-10-CM

## 2016-07-18 LAB — CBC WITH DIFFERENTIAL/PLATELET
BASOS ABS: 0 {cells}/uL (ref 0–200)
Basophils Relative: 0 %
EOS PCT: 1 %
Eosinophils Absolute: 67 cells/uL (ref 15–500)
HCT: 41.9 % (ref 35.0–45.0)
HEMOGLOBIN: 13.9 g/dL (ref 11.7–15.5)
LYMPHS PCT: 31 %
Lymphs Abs: 2077 cells/uL (ref 850–3900)
MCH: 30.8 pg (ref 27.0–33.0)
MCHC: 33.2 g/dL (ref 32.0–36.0)
MCV: 92.9 fL (ref 80.0–100.0)
MONOS PCT: 5 %
MPV: 9.2 fL (ref 7.5–12.5)
Monocytes Absolute: 335 cells/uL (ref 200–950)
NEUTROS PCT: 63 %
Neutro Abs: 4221 cells/uL (ref 1500–7800)
Platelets: 350 10*3/uL (ref 140–400)
RBC: 4.51 MIL/uL (ref 3.80–5.10)
RDW: 12.7 % (ref 11.0–15.0)
WBC: 6.7 10*3/uL (ref 3.8–10.8)

## 2016-07-18 MED ORDER — RANITIDINE HCL 300 MG PO TABS: 300.0000 mg | ORAL_TABLET | Freq: Every day | ORAL | 5 refills | Status: DC

## 2016-07-18 MED ORDER — NORGESTIMATE-ETH ESTRADIOL 0.25-35 MG-MCG PO TABS: 1.0000 | ORAL_TABLET | Freq: Every day | ORAL | 12 refills | Status: DC

## 2016-07-18 NOTE — Assessment & Plan Note (Signed)
Check TSH since activity and dietary habits unchanged

## 2016-07-18 NOTE — Assessment & Plan Note (Signed)
No migraines, no DVT, no PE; nonsmoker; BP controlled; Rx approved

## 2016-07-18 NOTE — Assessment & Plan Note (Signed)
Pap smear collected today 

## 2016-07-18 NOTE — Patient Instructions (Addendum)
Caution: prolonged use of proton pump inhibitors like omeprazole (Prilosec), pantoprazole (Protonix), esomeprazole (Nexium), and others like Dexilant and Aciphex may increase your risk of pneumonia, Clostridium difficile colitis, osteoporosis, anemia and other health complications Try to limit or avoid triggers like coffee, caffeinated beverages, onions, chocolate, spicy foods, peppermint, acid foods like pizza, spaghetti sauce, and orange juice Lose weight if you are overweight or obese Try elevating the head of your bed by placing a small wedge between your mattress and box springs to keep acid in the stomach at night instead of coming up into your esophagus Check with exterminator; if scabies treatment needed, then call me We'll get labs today Health Maintenance, Female Adopting a healthy lifestyle and getting preventive care can go a long way to promote health and wellness. Talk with your health care provider about what schedule of regular examinations is right for you. This is a good chance for you to check in with your provider about disease prevention and staying healthy. In between checkups, there are plenty of things you can do on your own. Experts have done a lot of research about which lifestyle changes and preventive measures are most likely to keep you healthy. Ask your health care provider for more information. Weight and diet Eat a healthy diet  Be sure to include plenty of vegetables, fruits, low-fat dairy products, and lean protein.  Do not eat a lot of foods high in solid fats, added sugars, or salt.  Get regular exercise. This is one of the most important things you can do for your health. ? Most adults should exercise for at least 150 minutes each week. The exercise should increase your heart rate and make you sweat (moderate-intensity exercise). ? Most adults should also do strengthening exercises at least twice a week. This is in addition to the moderate-intensity  exercise.  Maintain a healthy weight  Body mass index (BMI) is a measurement that can be used to identify possible weight problems. It estimates body fat based on height and weight. Your health care provider can help determine your BMI and help you achieve or maintain a healthy weight.  For females 72 years of age and older: ? A BMI below 18.5 is considered underweight. ? A BMI of 18.5 to 24.9 is normal. ? A BMI of 25 to 29.9 is considered overweight. ? A BMI of 30 and above is considered obese.  Watch levels of cholesterol and blood lipids  You should start having your blood tested for lipids and cholesterol at 22 years of age, then have this test every 5 years.  You may need to have your cholesterol levels checked more often if: ? Your lipid or cholesterol levels are high. ? You are older than 22 years of age. ? You are at high risk for heart disease.  Cancer screening Lung Cancer  Lung cancer screening is recommended for adults 47-72 years old who are at high risk for lung cancer because of a history of smoking.  A yearly low-dose CT scan of the lungs is recommended for people who: ? Currently smoke. ? Have quit within the past 15 years. ? Have at least a 30-pack-year history of smoking. A pack year is smoking an average of one pack of cigarettes a day for 1 year.  Yearly screening should continue until it has been 15 years since you quit.  Yearly screening should stop if you develop a health problem that would prevent you from having lung cancer treatment.  Breast Cancer  Practice breast self-awareness. This means understanding how your breasts normally appear and feel.  It also means doing regular breast self-exams. Let your health care provider know about any changes, no matter how small.  If you are in your 20s or 30s, you should have a clinical breast exam (CBE) by a health care provider every 1-3 years as part of a regular health exam.  If you are 49 or older, have  a CBE every year. Also consider having a breast X-ray (mammogram) every year.  If you have a family history of breast cancer, talk to your health care provider about genetic screening.  If you are at high risk for breast cancer, talk to your health care provider about having an MRI and a mammogram every year.  Breast cancer gene (BRCA) assessment is recommended for women who have family members with BRCA-related cancers. BRCA-related cancers include: ? Breast. ? Ovarian. ? Tubal. ? Peritoneal cancers.  Results of the assessment will determine the need for genetic counseling and BRCA1 and BRCA2 testing.  Cervical Cancer Your health care provider may recommend that you be screened regularly for cancer of the pelvic organs (ovaries, uterus, and vagina). This screening involves a pelvic examination, including checking for microscopic changes to the surface of your cervix (Pap test). You may be encouraged to have this screening done every 3 years, beginning at age 43.  For women ages 41-65, health care providers may recommend pelvic exams and Pap testing every 3 years, or they may recommend the Pap and pelvic exam, combined with testing for human papilloma virus (HPV), every 5 years. Some types of HPV increase your risk of cervical cancer. Testing for HPV may also be done on women of any age with unclear Pap test results.  Other health care providers may not recommend any screening for nonpregnant women who are considered low risk for pelvic cancer and who do not have symptoms. Ask your health care provider if a screening pelvic exam is right for you.  If you have had past treatment for cervical cancer or a condition that could lead to cancer, you need Pap tests and screening for cancer for at least 20 years after your treatment. If Pap tests have been discontinued, your risk factors (such as having a new sexual partner) need to be reassessed to determine if screening should resume. Some women have  medical problems that increase the chance of getting cervical cancer. In these cases, your health care provider may recommend more frequent screening and Pap tests.  Colorectal Cancer  This type of cancer can be detected and often prevented.  Routine colorectal cancer screening usually begins at 22 years of age and continues through 22 years of age.  Your health care provider may recommend screening at an earlier age if you have risk factors for colon cancer.  Your health care provider may also recommend using home test kits to check for hidden blood in the stool.  A small camera at the end of a tube can be used to examine your colon directly (sigmoidoscopy or colonoscopy). This is done to check for the earliest forms of colorectal cancer.  Routine screening usually begins at age 50.  Direct examination of the colon should be repeated every 5-10 years through 22 years of age. However, you may need to be screened more often if early forms of precancerous polyps or small growths are found.  Skin Cancer  Check your skin from head to toe regularly.  Tell your health care  provider about any new moles or changes in moles, especially if there is a change in a mole's shape or color.  Also tell your health care provider if you have a mole that is larger than the size of a pencil eraser.  Always use sunscreen. Apply sunscreen liberally and repeatedly throughout the day.  Protect yourself by wearing long sleeves, pants, a wide-brimmed hat, and sunglasses whenever you are outside.  Heart disease, diabetes, and high blood pressure  High blood pressure causes heart disease and increases the risk of stroke. High blood pressure is more likely to develop in: ? People who have blood pressure in the high end of the normal range (130-139/85-89 mm Hg). ? People who are overweight or obese. ? People who are African American.  If you are 24-58 years of age, have your blood pressure checked every 3-5  years. If you are 68 years of age or older, have your blood pressure checked every year. You should have your blood pressure measured twice-once when you are at a hospital or clinic, and once when you are not at a hospital or clinic. Record the average of the two measurements. To check your blood pressure when you are not at a hospital or clinic, you can use: ? An automated blood pressure machine at a pharmacy. ? A home blood pressure monitor.  If you are between 53 years and 39 years old, ask your health care provider if you should take aspirin to prevent strokes.  Have regular diabetes screenings. This involves taking a blood sample to check your fasting blood sugar level. ? If you are at a normal weight and have a low risk for diabetes, have this test once every three years after 22 years of age. ? If you are overweight and have a high risk for diabetes, consider being tested at a younger age or more often. Preventing infection Hepatitis B  If you have a higher risk for hepatitis B, you should be screened for this virus. You are considered at high risk for hepatitis B if: ? You were born in a country where hepatitis B is common. Ask your health care provider which countries are considered high risk. ? Your parents were born in a high-risk country, and you have not been immunized against hepatitis B (hepatitis B vaccine). ? You have HIV or AIDS. ? You use needles to inject street drugs. ? You live with someone who has hepatitis B. ? You have had sex with someone who has hepatitis B. ? You get hemodialysis treatment. ? You take certain medicines for conditions, including cancer, organ transplantation, and autoimmune conditions.  Hepatitis C  Blood testing is recommended for: ? Everyone born from [birth year] through 1966. ? Anyone with known risk factors for hepatitis C.  Sexually transmitted infections (STIs)  You should be screened for sexually transmitted infections (STIs) including  gonorrhea and chlamydia if: ? You are sexually active and are younger than 22 years of age. ? You are older than 22 years of age and your health care provider tells you that you are at risk for this type of infection. ? Your sexual activity has changed since you were last screened and you are at an increased risk for chlamydia or gonorrhea. Ask your health care provider if you are at risk.  If you do not have HIV, but are at risk, it may be recommended that you take a prescription medicine daily to prevent HIV infection. This is called pre-exposure prophylaxis (PrEP). You  are considered at risk if: ? You are sexually active and do not regularly use condoms or know the HIV status of your partner(s). ? You take drugs by injection. ? You are sexually active with a partner who has HIV.  Talk with your health care provider about whether you are at high risk of being infected with HIV. If you choose to begin PrEP, you should first be tested for HIV. You should then be tested every 3 months for as long as you are taking PrEP. Pregnancy  If you are premenopausal and you may become pregnant, ask your health care provider about preconception counseling.  If you may become pregnant, take 400 to 800 micrograms (mcg) of folic acid every day.  If you want to prevent pregnancy, talk to your health care provider about birth control (contraception). Osteoporosis and menopause  Osteoporosis is a disease in which the bones lose minerals and strength with aging. This can result in serious bone fractures. Your risk for osteoporosis can be identified using a bone density scan.  If you are 70 years of age or older, or if you are at risk for osteoporosis and fractures, ask your health care provider if you should be screened.  Ask your health care provider whether you should take a calcium or vitamin D supplement to lower your risk for osteoporosis.  Menopause may have certain physical symptoms and  risks.  Hormone replacement therapy may reduce some of these symptoms and risks. Talk to your health care provider about whether hormone replacement therapy is right for you. Follow these instructions at home:  Schedule regular health, dental, and eye exams.  Stay current with your immunizations.  Do not use any tobacco products including cigarettes, chewing tobacco, or electronic cigarettes.  If you are pregnant, do not drink alcohol.  If you are breastfeeding, limit how much and how often you drink alcohol.  Limit alcohol intake to no more than 1 drink per day for nonpregnant women. One drink equals 12 ounces of beer, 5 ounces of wine, or 1 ounces of hard liquor.  Do not use street drugs.  Do not share needles.  Ask your health care provider for help if you need support or information about quitting drugs.  Tell your health care provider if you often feel depressed.  Tell your health care provider if you have ever been abused or do not feel safe at home. This information is not intended to replace advice given to you by your health care provider. Make sure you discuss any questions you have with your health care provider. Document Released: 07/18/2010 Document Revised: 06/10/2015 Document Reviewed: 10/06/2014 Elsevier Interactive Patient Education  [employer].

## 2016-07-18 NOTE — Progress Notes (Signed)
Patient ID: Brittany Johns, female   DOB: Aug 12, 1995, 21 y.o.   MRN: [id number]   Subjective:   Brittany Johns is a 21 y.o. female here for a complete physical exam  Interim issues since last visit: no medical excitement  Other issue: acid reflux; she has a lot of acid reflux; wakes up with it and has to eat something in the morning or it hurts really bad; comes up into the throat; burns quite often; chewing tums more than anything; uses store-bought; father also has acid reflux; no specific foods; once or twice a week; not eating late at night  Gets attacked by bugs in the house; lips will swell when bitten around them; has had bug bombs and sprayed for fleas; wonders if on the skin; really itchy; she has not had an exterminator come in; has spots on the skin; wonders about scabies; lives with parents and fiance; no one in the house affected;   USPSTF grade A and B recommendations Depression:  Depression screen [facility] 2/9 07/18/2016 07/13/2015  Decreased Interest 0 0  Down, Depressed, Hopeless 0 0  PHQ - 2 Score 0 0   Hypertension: BP Readings from Last 3 Encounters:  07/18/16 126/72  07/13/15 114/72  01/07/15 106/72   Obesity: Wt Readings from Last 3 Encounters:  07/18/16 192 lb 4.8 oz (87.2 kg)  07/13/15 182 lb (82.6 kg)  01/07/15 173 lb 6.4 oz (78.7 kg)   BMI Readings from Last 3 Encounters:  07/18/16 33.01 kg/m  07/13/15 31.00 kg/m  01/07/15 29.53 kg/m    Alcohol: occasionally, one a week Tobacco use: none HIV, hep B, hep C: check STD testing and prevention (chl/gon/syphilis): check Intimate partner violence: no abuse; just got engaged Breast cancer: no lumps BRCA gene screening: no breast or ovarian cancer Cervical cancer screening: today Osteoporosis: n/a Fall prevention/vitamin D: gets some sun Lipids:  Lab Results  Component Value Date   CHOL 161 07/14/2015   CHOL 179 (H) 06/26/2014   Lab Results  Component Value Date   HDL 63 07/14/2015    HDL 59 06/26/2014   Lab Results  Component Value Date   LDLCALC 84 07/14/2015   [place] 109 06/26/2014   Lab Results  Component Value Date   TRIG 72 07/14/2015   TRIG 55 06/26/2014   Lab Results  Component Value Date   CHOLHDL 2.6 07/14/2015   CHOLHDL 3.0 06/26/2014   No results found for: LDLDIRECT Glucose:  Glucose  Date Value Ref Range Status  06/26/2014 82 65 - 99 mg/dL Final   Glucose, Bld  Date Value Ref Range Status  07/14/2015 95 65 - 99 mg/dL Final   Colorectal cancer: no fam hx; start at age 89 or 49 per current guidelines Lung cancer:  n/a AAA: n/a Aspirin: n/a Diet: no sodas; no extra calories; good portions Exercise: going to work out more Skin cancer: nothing worrisome   Past Medical History:  Diagnosis Date  . Encounter for birth control pills maintenance 07/13/2015  . Obesity 07/13/2015   Past Surgical History:  Procedure Laterality Date  . WISDOM TOOTH EXTRACTION     Family History  Problem Relation Age of Onset  . Hypertension Mother   . Diabetes Maternal Grandmother        BORDERLINE  . Stroke Maternal Grandmother   . Heart disease Maternal Grandfather   . Heart disease Paternal Grandfather    Social History  Substance Use Topics  . Smoking status: Never Smoker  . Smokeless tobacco:  Never Used  . Alcohol use 0.6 oz/week    1 Shots of liquor per week     Comment: 1 drink a week   Review of Systems  Respiratory: Negative for shortness of breath.   Cardiovascular: Negative for chest pain.  Gastrointestinal: Negative for blood in stool.       Reflux  Genitourinary: Negative for menstrual problem.  Hematological: Does not bruise/bleed easily.   No migraines with aura; no hx of DVT or PE  Objective:   Vitals:   07/18/16 0835  BP: 126/72  Pulse: 98  Resp: 14  Temp: 98.3 F (36.8 C)  TempSrc: Oral  SpO2: 97%  Weight: 192 lb 4.8 oz (87.2 kg)  Height: _0  (1.626 m)   Body mass index is 33.01 kg/m. Wt Readings from Last 3  Encounters:  07/18/16 192 lb 4.8 oz (87.2 kg)  07/13/15 182 lb (82.6 kg)  01/07/15 173 lb 6.4 oz (78.7 kg)   Physical Exam  Constitutional: She appears well-developed and well-nourished.  HENT:  Head: Normocephalic and atraumatic.  Eyes: Conjunctivae and EOM are normal. Right eye exhibits no hordeolum. Left eye exhibits no hordeolum. No scleral icterus.  Neck: Carotid bruit is not present. No thyromegaly present.  Cardiovascular: Normal rate, regular rhythm, S1 normal, S2 normal and normal heart sounds.   No extrasystoles are present.  Pulmonary/Chest: Effort normal and breath sounds normal. No respiratory distress. Right breast exhibits no inverted nipple, no mass, no nipple discharge, no skin change and no tenderness. Left breast exhibits no inverted nipple, no mass, no nipple discharge, no skin change and no tenderness. Breasts are symmetrical.  Abdominal: Soft. Normal appearance and bowel sounds are normal. She exhibits no distension, no abdominal bruit, no pulsatile midline mass and no mass. There is no hepatosplenomegaly. There is no tenderness. No hernia.  Genitourinary: Uterus normal. Pelvic exam was performed with patient prone. There is no rash or lesion on the right labia. There is no rash or lesion on the left labia. Cervix exhibits no motion tenderness. Right adnexum displays no mass, no tenderness and no fullness. Left adnexum displays no mass, no tenderness and no fullness. Vaginal discharge (moderate amount of thin watery discharge) found.  Genitourinary Comments: Thin prep collected  Musculoskeletal: Normal range of motion. She exhibits no edema.  Lymphadenopathy:       Head (right side): No submandibular adenopathy present.       Head (left side): No submandibular adenopathy present.    She has no cervical adenopathy.    She has no axillary adenopathy.  Neurological: She is alert. She displays no tremor. No cranial nerve deficit. She exhibits normal muscle tone. Gait normal.   Skin: Skin is warm and dry. No bruising and no ecchymosis noted. No cyanosis. No pallor.  Two small erythematous marks 1 mm on the RIGHT flank, abdomen; no other markings suggestive of bites or excoriations  Psychiatric: Her speech is normal and behavior is normal. Thought content normal. Her mood appears not anxious. She does not exhibit a depressed mood.    Assessment/Plan:   Problem List Items Addressed This Visit      Digestive   Acid reflux    Avoid trigger foods; start H2 blocker; explained plan going forward; if not getting better, then call for testing (H pylori); see AVS      Relevant Medications   ranitidine (ZANTAC) 300 MG tablet     Other   Preventative health care - Primary    USPSTF grade  A and B recommendations reviewed with patient; age-appropriate recommendations, preventive care, screening tests, etc discussed and encouraged; healthy living encouraged; see AVS for patient education given to patient       Relevant Orders   CBC with Differential/Platelet   COMPLETE METABOLIC PANEL WITH GFR   Lipid panel   TSH   Obesity    Check TSH since activity and dietary habits unchanged      Encounter for birth control pills maintenance    No migraines, no DVT, no PE; nonsmoker; BP controlled; Rx approved      Cervical cancer screening    Pap smear collected today      Relevant Orders   Pap IG, CT/NG w/ reflex HPV when ASC-U   Bug bites    Contact exterminator; can treat for possible scabies, though no one else in home is affected; pt may call for Rx       Other Visit Diagnoses    Screen for STD (sexually transmitted disease)       Relevant Orders   STD Panel (HBSAG,HIV,RPR)   Vaginal discharge       Relevant Orders   WET PREP BY MOLECULAR PROBE       Meds ordered this encounter  Medications  . ranitidine (ZANTAC) 300 MG tablet    Sig: Take 1 tablet (300 mg total) by mouth at bedtime.    Dispense:  30 tablet    Refill:  5  . norgestimate-ethinyl  estradiol (SPRINTEC 28) 0.25-35 MG-MCG tablet    Sig: Take 1 tablet by mouth daily.    Dispense:  1 Package    Refill:  12   Orders Placed This Encounter  Procedures  . WET PREP BY MOLECULAR PROBE  . STD Panel (HBSAG,HIV,RPR)  . CBC with Differential/Platelet  . COMPLETE METABOLIC PANEL WITH GFR  . Lipid panel  . TSH    Follow up plan: Return in about 1 year (around 07/18/2017) for complete physical.  An After Visit Summary was printed and given to the patient.

## 2016-07-18 NOTE — Assessment & Plan Note (Signed)
USPSTF grade A and B recommendations reviewed with patient; age-appropriate recommendations, preventive care, screening tests, etc discussed and encouraged; healthy living encouraged; see AVS for patient education given to patient  

## 2016-07-18 NOTE — Assessment & Plan Note (Signed)
Contact exterminator; can treat for possible scabies, though no one else in home is affected; pt may call for Rx

## 2016-07-18 NOTE — Assessment & Plan Note (Signed)
Avoid trigger foods; start H2 blocker; explained plan going forward; if not getting better, then call for testing (H pylori); see AVS

## 2016-07-19 LAB — LIPID PANEL
CHOL/HDL RATIO: 3.3 ratio (ref <5.0)
CHOLESTEROL: 178 mg/dL (ref <200)
HDL: 54 mg/dL (ref >50)
LDL Cholesterol: 108 mg/dL — ABNORMAL HIGH (ref <100)
Triglycerides: 79 mg/dL (ref <150)
VLDL: 16 mg/dL (ref <30)

## 2016-07-19 LAB — COMPLETE METABOLIC PANEL WITH GFR
ALBUMIN: 4.3 g/dL (ref 3.6–5.1)
ALK PHOS: 45 U/L (ref 33–115)
ALT: 8 U/L (ref 6–29)
AST: 10 U/L (ref 10–30)
BUN: 14 mg/dL (ref 7–25)
CHLORIDE: 105 mmol/L (ref 98–110)
CO2: 23 mmol/L (ref 20–31)
Calcium: 9.3 mg/dL (ref 8.6–10.2)
Creat: 0.84 mg/dL (ref 0.50–1.10)
GFR, Est African American: >89 mL/min (ref >=60)
GLUCOSE: 81 mg/dL (ref 65–99)
POTASSIUM: 4.4 mmol/L (ref 3.5–5.3)
SODIUM: 139 mmol/L (ref 135–146)
Total Bilirubin: 0.4 mg/dL (ref 0.2–1.2)
Total Protein: 6.9 g/dL (ref 6.1–8.1)

## 2016-07-19 LAB — TSH: TSH: 1.65 m[IU]/L

## 2016-07-20 ENCOUNTER — Other Ambulatory Visit: Payer: Self-pay | Admitting: Family Medicine

## 2016-07-20 LAB — WET PREP BY MOLECULAR PROBE
CANDIDA SPECIES: NOT DETECTED
GARDNERELLA VAGINALIS: DETECTED — AB
TRICHOMONAS VAG: NOT DETECTED

## 2016-07-20 LAB — HIV ANTIBODY (ROUTINE TESTING W REFLEX): HIV: NONREACTIVE

## 2016-07-20 MED ORDER — METRONIDAZOLE 500 MG PO TABS: 500.0000 mg | ORAL_TABLET | Freq: Two times a day (BID) | ORAL | 0 refills | Status: DC

## 2016-07-21 ENCOUNTER — Telehealth: Payer: Self-pay

## 2016-07-21 LAB — HEPATITIS B SURFACE ANTIGEN: HEP B S AG: NEGATIVE

## 2016-07-21 LAB — RPR

## 2016-07-21 NOTE — Telephone Encounter (Signed)
-----   Message from Kerman PasseyMelinda P Lada, MD sent at 07/20/2016 12:59 PM EDT ----- Please let the patient know that she does in fact have BV, not an STI, completely treatable; I sent Rx in to pharmacy Not anemic; normal glucose; cholesterol is pretty good; thyroid normal; few labs pending

## 2016-07-21 NOTE — Telephone Encounter (Signed)
Left a messaged to call our office.

## 2016-07-24 LAB — PAP IG, CT-NG, RFX HPV ASCU
CHLAMYDIA PROBE AMP: NOT DETECTED
GC Probe Amp: NOT DETECTED

## 2016-07-26 ENCOUNTER — Telehealth: Payer: Self-pay | Admitting: Family Medicine

## 2016-07-26 NOTE — Telephone Encounter (Signed)
Patient states the flagyl is making her very nauseas and sick on stomach, she does take with food.  She has not actually thrown up be feels bad is there anything else that could be given?

## 2016-07-26 NOTE — Telephone Encounter (Signed)
Requesting return call to discuss meds [phone]

## 2016-07-27 MED ORDER — CLINDAMYCIN PHOSPHATE 2 % VA CREA: 1.0000 | TOPICAL_CREAM | Freq: Every day | VAGINAL | 0 refills | Status: DC

## 2016-07-27 NOTE — Telephone Encounter (Signed)
Patient notified

## 2016-07-27 NOTE — Telephone Encounter (Signed)
Stop the metronidazole, add to adverse reactions; start new medicine; call w/problems

## 2017-04-16 ENCOUNTER — Encounter: Payer: Self-pay | Admitting: Family Medicine

## 2017-04-16 DIAGNOSIS — L501 Idiopathic urticaria: Secondary | ICD-10-CM | POA: Insufficient documentation

## 2017-07-20 ENCOUNTER — Other Ambulatory Visit: Payer: Self-pay

## 2017-07-20 ENCOUNTER — Encounter: Payer: Self-pay | Admitting: Family Medicine

## 2017-07-20 ENCOUNTER — Ambulatory Visit (INDEPENDENT_AMBULATORY_CARE_PROVIDER_SITE_OTHER): Payer: Managed Care, Other (non HMO) | Admitting: Family Medicine

## 2017-07-20 VITALS — BP 112/76 | HR 92 | Temp 98.3°F | Resp 16 | Ht 65.0 in | Wt 189.0 lb

## 2017-07-20 DIAGNOSIS — Z0001 Encounter for general adult medical examination with abnormal findings: Secondary | ICD-10-CM

## 2017-07-20 DIAGNOSIS — Z0000 Encounter for general adult medical examination without abnormal findings: Secondary | ICD-10-CM

## 2017-07-20 DIAGNOSIS — Z118 Encounter for screening for other infectious and parasitic diseases: Secondary | ICD-10-CM

## 2017-07-20 DIAGNOSIS — Z3041 Encounter for surveillance of contraceptive pills: Secondary | ICD-10-CM

## 2017-07-20 DIAGNOSIS — E6609 Other obesity due to excess calories: Secondary | ICD-10-CM | POA: Diagnosis not present

## 2017-07-20 DIAGNOSIS — K219 Gastro-esophageal reflux disease without esophagitis: Secondary | ICD-10-CM

## 2017-07-20 DIAGNOSIS — Z6833 Body mass index (BMI) 33.0-33.9, adult: Secondary | ICD-10-CM | POA: Diagnosis not present

## 2017-07-20 MED ORDER — OMEPRAZOLE 20 MG PO CPDR: 20.0000 mg | DELAYED_RELEASE_CAPSULE | Freq: Every day | ORAL | 0 refills | Status: DC

## 2017-07-20 MED ORDER — NORGESTIMATE-ETH ESTRADIOL 0.25-35 MG-MCG PO TABS: 1.0000 | ORAL_TABLET | Freq: Every day | ORAL | 12 refills | Status: DC

## 2017-07-20 NOTE — Assessment & Plan Note (Signed)
Given tips

## 2017-07-20 NOTE — Assessment & Plan Note (Signed)
USPSTF grade A and B recommendations reviewed with patient; age-appropriate recommendations, preventive care, screening tests, etc discussed and encouraged; healthy living encouraged; see AVS for patient education given to patient  

## 2017-07-20 NOTE — Patient Instructions (Addendum)
Caution: prolonged use of proton pump inhibitors like omeprazole (Prilosec), pantoprazole (Protonix), esomeprazole (Nexium), and others like Dexilant and Aciphex may increase your risk of pneumonia, Clostridium difficile colitis, osteoporosis, anemia and other health complications Try to limit or avoid triggers like coffee, caffeinated beverages, onions, chocolate, spicy foods, peppermint, acidic foods like pizza, spaghetti sauce, and orange juice Lose weight if you are overweight or obese Try elevating the head of your bed by placing a small wedge between your mattress and box springs to keep acid in the stomach at night instead of coming up into your esophagus   Gastroesophageal Reflux Disease, Adult Normally, food travels down the esophagus and stays in the stomach to be digested. However, when a person has gastroesophageal reflux disease (GERD), food and stomach acid move back up into the esophagus. When this happens, the esophagus becomes sore and inflamed. Over time, GERD can create small holes (ulcers) in the lining of the esophagus. What are the causes? This condition is caused by a problem with the muscle between the esophagus and the stomach (lower esophageal sphincter, or LES). Normally, the LES muscle closes after food passes through the esophagus to the stomach. When the LES is weakened or abnormal, it does not close properly, and that allows food and stomach acid to go back up into the esophagus. The LES can be weakened by certain dietary substances, medicines, and medical conditions, including:  Tobacco use.  Pregnancy.  Having a hiatal hernia.  Heavy alcohol use.  Certain foods and beverages, such as coffee, chocolate, onions, and peppermint.  What increases the risk? This condition is more likely to develop in:  People who have an increased body weight.  People who have connective tissue disorders.  People who use NSAID medicines.  What are the signs or  symptoms? Symptoms of this condition include:  Heartburn.  Difficult or painful swallowing.  The feeling of having a lump in the throat.  Abitter taste in the mouth.  Bad breath.  Having a large amount of saliva.  Having an upset or bloated stomach.  Belching.  Chest pain.  Shortness of breath or wheezing.  Ongoing (chronic) cough or a night-time cough.  Wearing away of tooth enamel.  Weight loss.  Different conditions can cause chest pain. Make sure to see your health care provider if you experience chest pain. How is this diagnosed? Your health care provider will take a medical history and perform a physical exam. To determine if you have mild or severe GERD, your health care provider may also monitor how you respond to treatment. You may also have other tests, including:  An endoscopy toexamine your stomach and esophagus with a small camera.  A test thatmeasures the acidity level in your esophagus.  A test thatmeasures how much pressure is on your esophagus.  A barium swallow or modified barium swallow to show the shape, size, and functioning of your esophagus.  How is this treated? The goal of treatment is to help relieve your symptoms and to prevent complications. Treatment for this condition may vary depending on how severe your symptoms are. Your health care provider may recommend:  Changes to your diet.  Medicine.  Surgery.  Follow these instructions at home: Diet  Follow a diet as recommended by your health care provider. This may involve avoiding foods and drinks such as: ? Coffee and tea (with or without caffeine). ? Drinks that containalcohol. ? Energy drinks and sports drinks. ? Carbonated drinks or sodas. ? Chocolate and cocoa. ?  Peppermint and mint flavorings. ? Garlic and onions. ? Horseradish. ? Spicy and acidic foods, including peppers, chili powder, curry powder, vinegar, hot sauces, and barbecue sauce. ? Citrus fruit juices and  citrus fruits, such as oranges, lemons, and limes. ? Tomato-based foods, such as red sauce, chili, salsa, and pizza with red sauce. ? Fried and fatty foods, such as donuts, french fries, potato chips, and high-fat dressings. ? High-fat meats, such as hot dogs and fatty cuts of red and white meats, such as rib eye steak, sausage, ham, and bacon. ? High-fat dairy items, such as whole milk, butter, and cream cheese.  Eat small, frequent meals instead of large meals.  Avoid drinking large amounts of liquid with your meals.  Avoid eating meals during the 2-3 hours before bedtime.  Avoid lying down right after you eat.  Do not exercise right after you eat. General instructions  Pay attention to any changes in your symptoms.  Take over-the-counter and prescription medicines only as told by your health care provider. Do not take aspirin, ibuprofen, or other NSAIDs unless your health care provider told you to do so.  Do not use any tobacco products, including cigarettes, chewing tobacco, and e-cigarettes. If you need help quitting, ask your health care provider.  Wear loose-fitting clothing. Do not wear anything tight around your waist that causes pressure on your abdomen.  Raise (elevate) the head of your bed 6 inches (15cm).  Try to reduce your stress, such as with yoga or meditation. If you need help reducing stress, ask your health care provider.  If you are overweight, reduce your weight to an amount that is healthy for you. Ask your health care provider for guidance about a safe weight loss goal.  Keep all follow-up visits as told by your health care provider. This is important. Contact a health care provider if:  You have new symptoms.  You have unexplained weight loss.  You have difficulty swallowing, or it hurts to swallow.  You have wheezing or a persistent cough.  Your symptoms do not improve with treatment.  You have a hoarse voice. Get help right away if:  You have  pain in your arms, neck, jaw, teeth, or back.  You feel sweaty, dizzy, or light-headed.  You have chest pain or shortness of breath.  You vomit and your vomit looks like blood or coffee grounds.  You faint.  Your stool is bloody or black.  You cannot swallow, drink, or eat. This information is not intended to replace advice given to you by your health care provider. Make sure you discuss any questions you have with your health care provider. Document Released: 10/12/2004 Document Revised: 06/02/2015 Document Reviewed: 04/29/2014 Elsevier Interactive Patient Education  2018 [place] Maintenance, Female Adopting a healthy lifestyle and getting preventive care can go a long way to promote health and wellness. Talk with your health care provider about what schedule of regular examinations is right for you. This is a good chance for you to check in with your provider about disease prevention and staying healthy. In between checkups, there are plenty of things you can do on your own. Experts have done a lot of research about which lifestyle changes and preventive measures are most likely to keep you healthy. Ask your health care provider for more information. Weight and diet Eat a healthy diet  Be sure to include plenty of vegetables, fruits, low-fat dairy products, and lean protein.  Do not eat a lot of foods  high in solid fats, added sugars, or salt.  Get regular exercise. This is one of the most important things you can do for your health. ? Most adults should exercise for at least 150 minutes each week. The exercise should increase your heart rate and make you sweat (moderate-intensity exercise). ? Most adults should also do strengthening exercises at least twice a week. This is in addition to the moderate-intensity exercise.  Maintain a healthy weight  Body mass index (BMI) is a measurement that can be used to identify possible weight problems. It estimates body fat based  on height and weight. Your health care provider can help determine your BMI and help you achieve or maintain a healthy weight.  For females 91 years of age and older: ? A BMI below 18.5 is considered underweight. ? A BMI of 18.5 to 24.9 is normal. ? A BMI of 25 to 29.9 is considered overweight. ? A BMI of 30 and above is considered obese.  Watch levels of cholesterol and blood lipids  You should start having your blood tested for lipids and cholesterol at 23 years of age, then have this test every 5 years.  You may need to have your cholesterol levels checked more often if: ? Your lipid or cholesterol levels are high. ? You are older than 23 years of age. ? You are at high risk for heart disease.  Cancer screening Lung Cancer  Lung cancer screening is recommended for adults 24-88 years old who are at high risk for lung cancer because of a history of smoking.  A yearly low-dose CT scan of the lungs is recommended for people who: ? Currently smoke. ? Have quit within the past 15 years. ? Have at least a 30-pack-year history of smoking. A pack year is smoking an average of one pack of cigarettes a day for 1 year.  Yearly screening should continue until it has been 15 years since you quit.  Yearly screening should stop if you develop a health problem that would prevent you from having lung cancer treatment.  Breast Cancer  Practice breast self-awareness. This means understanding how your breasts normally appear and feel.  It also means doing regular breast self-exams. Let your health care provider know about any changes, no matter how small.  If you are in your 20s or 30s, you should have a clinical breast exam (CBE) by a health care provider every 1-3 years as part of a regular health exam.  If you are 22 or older, have a CBE every year. Also consider having a breast X-ray (mammogram) every year.  If you have a family history of breast cancer, talk to your health care provider  about genetic screening.  If you are at high risk for breast cancer, talk to your health care provider about having an MRI and a mammogram every year.  Breast cancer gene (BRCA) assessment is recommended for women who have family members with BRCA-related cancers. BRCA-related cancers include: ? Breast. ? Ovarian. ? Tubal. ? Peritoneal cancers.  Results of the assessment will determine the need for genetic counseling and BRCA1 and BRCA2 testing.  Cervical Cancer Your health care provider may recommend that you be screened regularly for cancer of the pelvic organs (ovaries, uterus, and vagina). This screening involves a pelvic examination, including checking for microscopic changes to the surface of your cervix (Pap test). You may be encouraged to have this screening done every 3 years, beginning at age 25.  For women ages 70-65,  health care providers may recommend pelvic exams and Pap testing every 3 years, or they may recommend the Pap and pelvic exam, combined with testing for human papilloma virus (HPV), every 5 years. Some types of HPV increase your risk of cervical cancer. Testing for HPV may also be done on women of any age with unclear Pap test results.  Other health care providers may not recommend any screening for nonpregnant women who are considered low risk for pelvic cancer and who do not have symptoms. Ask your health care provider if a screening pelvic exam is right for you.  If you have had past treatment for cervical cancer or a condition that could lead to cancer, you need Pap tests and screening for cancer for at least 20 years after your treatment. If Pap tests have been discontinued, your risk factors (such as having a new sexual partner) need to be reassessed to determine if screening should resume. Some women have medical problems that increase the chance of getting cervical cancer. In these cases, your health care provider may recommend more frequent screening and Pap  tests.  Colorectal Cancer  This type of cancer can be detected and often prevented.  Routine colorectal cancer screening usually begins at 23 years of age and continues through 23 years of age.  Your health care provider may recommend screening at an earlier age if you have risk factors for colon cancer.  Your health care provider may also recommend using home test kits to check for hidden blood in the stool.  A small camera at the end of a tube can be used to examine your colon directly (sigmoidoscopy or colonoscopy). This is done to check for the earliest forms of colorectal cancer.  Routine screening usually begins at age 30.  Direct examination of the colon should be repeated every 5-10 years through 23 years of age. However, you may need to be screened more often if early forms of precancerous polyps or small growths are found.  Skin Cancer  Check your skin from head to toe regularly.  Tell your health care provider about any new moles or changes in moles, especially if there is a change in a mole's shape or color.  Also tell your health care provider if you have a mole that is larger than the size of a pencil eraser.  Always use sunscreen. Apply sunscreen liberally and repeatedly throughout the day.  Protect yourself by wearing long sleeves, pants, a wide-brimmed hat, and sunglasses whenever you are outside.  Heart disease, diabetes, and high blood pressure  High blood pressure causes heart disease and increases the risk of stroke. High blood pressure is more likely to develop in: ? People who have blood pressure in the high end of the normal range (130-139/85-89 mm Hg). ? People who are overweight or obese. ? People who are African American.  If you are 67-77 years of age, have your blood pressure checked every 3-5 years. If you are 80 years of age or older, have your blood pressure checked every year. You should have your blood pressure measured twice-once when you are at  a hospital or clinic, and once when you are not at a hospital or clinic. Record the average of the two measurements. To check your blood pressure when you are not at a hospital or clinic, you can use: ? An automated blood pressure machine at a pharmacy. ? A home blood pressure monitor.  If you are between 52 years and 34 years old,  ask your health care provider if you should take aspirin to prevent strokes.  Have regular diabetes screenings. This involves taking a blood sample to check your fasting blood sugar level. ? If you are at a normal weight and have a low risk for diabetes, have this test once every three years after 23 years of age. ? If you are overweight and have a high risk for diabetes, consider being tested at a younger age or more often. Preventing infection Hepatitis B  If you have a higher risk for hepatitis B, you should be screened for this virus. You are considered at high risk for hepatitis B if: ? You were born in a country where hepatitis B is common. Ask your health care provider which countries are considered high risk. ? Your parents were born in a high-risk country, and you have not been immunized against hepatitis B (hepatitis B vaccine). ? You have HIV or AIDS. ? You use needles to inject street drugs. ? You live with someone who has hepatitis B. ? You have had sex with someone who has hepatitis B. ? You get hemodialysis treatment. ? You take certain medicines for conditions, including cancer, organ transplantation, and autoimmune conditions.  Hepatitis C  Blood testing is recommended for: ? Everyone born from [birth year] through 1966. ? Anyone with known risk factors for hepatitis C.  Sexually transmitted infections (STIs)  You should be screened for sexually transmitted infections (STIs) including gonorrhea and chlamydia if: ? You are sexually active and are younger than 23 years of age. ? You are older than 23 years of age and your health care provider tells  you that you are at risk for this type of infection. ? Your sexual activity has changed since you were last screened and you are at an increased risk for chlamydia or gonorrhea. Ask your health care provider if you are at risk.  If you do not have HIV, but are at risk, it may be recommended that you take a prescription medicine daily to prevent HIV infection. This is called pre-exposure prophylaxis (PrEP). You are considered at risk if: ? You are sexually active and do not regularly use condoms or know the HIV status of your partner(s). ? You take drugs by injection. ? You are sexually active with a partner who has HIV.  Talk with your health care provider about whether you are at high risk of being infected with HIV. If you choose to begin PrEP, you should first be tested for HIV. You should then be tested every 3 months for as long as you are taking PrEP. Pregnancy  If you are premenopausal and you may become pregnant, ask your health care provider about preconception counseling.  If you may become pregnant, take 400 to 800 micrograms (mcg) of folic acid every day.  If you want to prevent pregnancy, talk to your health care provider about birth control (contraception). Osteoporosis and menopause  Osteoporosis is a disease in which the bones lose minerals and strength with aging. This can result in serious bone fractures. Your risk for osteoporosis can be identified using a bone density scan.  If you are 40 years of age or older, or if you are at risk for osteoporosis and fractures, ask your health care provider if you should be screened.  Ask your health care provider whether you should take a calcium or vitamin D supplement to lower your risk for osteoporosis.  Menopause may have certain physical symptoms and risks.  Hormone  replacement therapy may reduce some of these symptoms and risks. Talk to your health care provider about whether hormone replacement therapy is right for  you. Follow these instructions at home:  Schedule regular health, dental, and eye exams.  Stay current with your immunizations.  Do not use any tobacco products including cigarettes, chewing tobacco, or electronic cigarettes.  If you are pregnant, do not drink alcohol.  If you are breastfeeding, limit how much and how often you drink alcohol.  Limit alcohol intake to no more than 1 drink per day for nonpregnant women. One drink equals 12 ounces of beer, 5 ounces of wine, or 1 ounces of hard liquor.  Do not use street drugs.  Do not share needles.  Ask your health care provider for help if you need support or information about quitting drugs.  Tell your health care provider if you often feel depressed.  Tell your health care provider if you have ever been abused or do not feel safe at home. This information is not intended to replace advice given to you by your health care provider. Make sure you discuss any questions you have with your health care provider. Document Released: 07/18/2010 Document Revised: 06/10/2015 Document Reviewed: 10/06/2014 Elsevier Interactive Patient Education  [employer].

## 2017-07-20 NOTE — Assessment & Plan Note (Signed)
Positive fam hx; getting worse; check H pylori; stop H2 blocker, start PPI; don't eat for 3 hours before bed; see AVS; if not better, then refer to GI

## 2017-07-20 NOTE — Assessment & Plan Note (Addendum)
Refills provided after confirming no hx of MI, no hx of CVA, no hx of DVT; no hx of migraine with aura; condom use encouraged

## 2017-07-20 NOTE — Progress Notes (Signed)
Patient ID: Brittany Johns, female   DOB: 1995-07-11, 22 y.o.   MRN: [id number]   Subjective:   Brittany Johns is a 22 y.o. female here for a complete physical exam  Interim issues since last visit: nothing major  Problem today: acid reflux; going on for years; getting to be weekly now, if not daily If she drinks water on an empty stomach, that will bother her She is trying to drink alkaline water and that helps some No blood in stool; some nausea No particular timing Father has reflux, more when he eats; acid pills help him She has tried ranitidine and that might have made it worse She has tried tums and that isn't even helping No abdominal pain Gets the acid upchucks all the time   USPSTF grade A and B recommendations Depression:  Depression screen [facility] 2/9 07/20/2017 07/18/2016 07/13/2015  Decreased Interest 0 0 0  Down, Depressed, Hopeless 0 0 0  PHQ - 2 Score 0 0 0   Hypertension: BP Readings from Last 3 Encounters:  07/20/17 112/76  07/18/16 126/72  07/13/15 114/72   Obesity: Wt Readings from Last 3 Encounters:  07/20/17 189 lb (85.7 kg)  07/18/16 192 lb 4.8 oz (87.2 kg)  07/13/15 182 lb (82.6 kg)   BMI Readings from Last 3 Encounters:  07/20/17 31.45 kg/m  07/18/16 33.01 kg/m  07/13/15 31.00 kg/m  OCP: no blood clots; no MI or CVA; no migraines with aura Skin cancer: nothing worrisome Lung cancer:  n/a Breast cancer: no breast problems; monthly SBE Colorectal cancer: no fam hx, start at 47 per current guidelines Cervical cancer screening: last year BRCA gene screening: family hx of breast and/or ovarian cancer and/or metastatic prostate cancer? No; uncle has prostate cancer, not mets in remission HIV, hep B, hep C: not interested STD testing and prevention (chl/gon/syphilis): not interested Intimate partner violence: no abuse Contraception: on OCP, not missing doses; condoms; requests refill of OCP Osteoporosis: some steroid for hives Fall  prevention/vitamin D: discussed; getting sun exposure Immunizations: recommended HPV, she'll think about it Diet: definitely fruits, trying more vegetables Exercise: doing a few days a week Alcohol: a few per week Tobacco use: no AAA: n/a Aspirin: n/a Glucose:  Glucose  Date Value Ref Range Status  06/26/2014 82 65 - 99 mg/dL Final   Glucose, Bld  Date Value Ref Range Status  07/18/2016 81 65 - 99 mg/dL Final  07/14/2015 95 65 - 99 mg/dL Final   Lipids:  Lab Results  Component Value Date   CHOL 178 07/18/2016   CHOL 161 07/14/2015   CHOL 179 (H) 06/26/2014   Lab Results  Component Value Date   HDL 54 07/18/2016   HDL 63 07/14/2015   HDL 59 06/26/2014   Lab Results  Component Value Date   LDLCALC 108 (H) 07/18/2016   LDLCALC 84 07/14/2015   LDLCALC 109 06/26/2014   Lab Results  Component Value Date   TRIG 79 07/18/2016   TRIG 72 07/14/2015   TRIG 55 06/26/2014   Lab Results  Component Value Date   CHOLHDL 3.3 07/18/2016   CHOLHDL 2.6 07/14/2015   CHOLHDL 3.0 06/26/2014   No results found for: LDLDIRECT   Past Medical History:  Diagnosis Date  . Encounter for birth control pills maintenance 07/13/2015  . Obesity 07/13/2015   Past Surgical History:  Procedure Laterality Date  . WISDOM TOOTH EXTRACTION     Family History  Problem Relation Age of Onset  . Hypertension Mother   .  Diabetes Maternal Grandmother        BORDERLINE  . Stroke Maternal Grandmother   . Heart disease Maternal Grandfather   . Heart disease Paternal Grandfather    Social History   Tobacco Use  . Smoking status: Never Smoker  . Smokeless tobacco: Never Used  Substance Use Topics  . Alcohol use: Yes    Alcohol/week: 0.6 oz    Types: 1 Shots of liquor per week    Comment: 1 drink a week  . Drug use: No   Review of Systems  Objective:   Vitals:   07/20/17 0833  BP: 112/76  Pulse: 92  Resp: 16  Temp: 98.3 F (36.8 C)  TempSrc: Oral  SpO2: 98%  Weight: 189 lb  (85.7 kg)  Height: 5' 5"  (1.651 m)   Body mass index is 31.45 kg/m. Wt Readings from Last 3 Encounters:  07/20/17 189 lb (85.7 kg)  07/18/16 192 lb 4.8 oz (87.2 kg)  07/13/15 182 lb (82.6 kg)   Physical Exam  Constitutional: She appears well-developed and well-nourished. No distress.  HENT:  Head: Normocephalic and atraumatic.  Eyes: EOM are normal. No scleral icterus.  Neck: No thyromegaly present.  Cardiovascular: Normal rate, regular rhythm and normal heart sounds.  No murmur heard. Pulmonary/Chest: Effort normal and breath sounds normal. No respiratory distress. She has no wheezes.  Abdominal: Soft. Bowel sounds are normal. She exhibits no distension.  Musculoskeletal: Normal range of motion. She exhibits no edema.  Neurological: She is alert. She exhibits normal muscle tone.  Skin: Skin is warm and dry. She is not diaphoretic. No pallor.  Psychiatric: She has a normal mood and affect. Her behavior is normal. Judgment and thought content normal.    Assessment/Plan:   Problem List Items Addressed This Visit      Digestive   Acid reflux (Chronic)    Positive fam hx; getting worse; check H pylori; stop H2 blocker, start PPI; don't eat for 3 hours before bed; see AVS; if not better, then refer to GI      Relevant Medications   omeprazole (PRILOSEC) 20 MG capsule   Other Relevant Orders   H. pylori breath test     Other   Preventative health care - Primary    USPSTF grade A and B recommendations reviewed with patient; age-appropriate recommendations, preventive care, screening tests, etc discussed and encouraged; healthy living encouraged; see AVS for patient education given to patient      Relevant Orders   COMPLETE METABOLIC PANEL WITH GFR   Obesity    Given tips      Encounter for birth control pills maintenance    Refills provided after confirming no hx of MI, no hx of CVA, no hx of DVT; no hx of migraine with aura; condom use encouraged       Other Visit  Diagnoses    Screening for chlamydial disease       screen for chlamydia; explained can be silent and lead to fallopian tube scarring, infertility   Relevant Orders   C. trachomatis/N. gonorrhoeae RNA       Meds ordered this encounter  Medications  . omeprazole (PRILOSEC) 20 MG capsule    Sig: Take 1 capsule (20 mg total) by mouth daily.    Dispense:  30 capsule    Refill:  0  . norgestimate-ethinyl estradiol (SPRINTEC 28) 0.25-35 MG-MCG tablet    Sig: Take 1 tablet by mouth daily.    Dispense:  1 Package  Refill:  12   Orders Placed This Encounter  Procedures  . C. trachomatis/N. gonorrhoeae RNA  . COMPLETE METABOLIC PANEL WITH GFR  . H. pylori breath test    Follow up plan: No follow-ups on file.  An After Visit Summary was printed and given to the patient.

## 2017-07-21 LAB — COMPREHENSIVE METABOLIC PANEL
A/G RATIO: 1.8 (ref 1.2–2.2)
ALBUMIN: 4.4 g/dL (ref 3.5–5.5)
ALK PHOS: 52 IU/L (ref 39–117)
ALT: 10 IU/L (ref 0–32)
AST: 12 IU/L (ref 0–40)
BUN / CREAT RATIO: 16 (ref 9–23)
BUN: 13 mg/dL (ref 6–20)
Bilirubin Total: 0.3 mg/dL (ref 0.0–1.2)
CALCIUM: 9.3 mg/dL (ref 8.7–10.2)
CO2: 20 mmol/L (ref 20–29)
Chloride: 106 mmol/L (ref 96–106)
Creatinine, Ser: 0.79 mg/dL (ref 0.57–1.00)
GFR calc Af Amer: 123 mL/min/{1.73_m2} (ref >59)
GFR, EST NON AFRICAN AMERICAN: 107 mL/min/{1.73_m2} (ref >59)
GLOBULIN, TOTAL: 2.4 g/dL (ref 1.5–4.5)
Glucose: 100 mg/dL — ABNORMAL HIGH (ref 65–99)
POTASSIUM: 4.3 mmol/L (ref 3.5–5.2)
Sodium: 141 mmol/L (ref 134–144)
Total Protein: 6.8 g/dL (ref 6.0–8.5)

## 2017-07-21 LAB — CBC WITH DIFFERENTIAL/PLATELET
BASOS: 0 %
Basophils Absolute: 0 10*3/uL (ref 0.0–0.2)
EOS (ABSOLUTE): 0.1 10*3/uL (ref 0.0–0.4)
EOS: 2 %
HEMATOCRIT: 41.6 % (ref 34.0–46.6)
Hemoglobin: 13.3 g/dL (ref 11.1–15.9)
Immature Grans (Abs): 0 10*3/uL (ref 0.0–0.1)
Immature Granulocytes: 0 %
LYMPHS ABS: 2.7 10*3/uL (ref 0.7–3.1)
Lymphs: 38 %
MCH: 29.6 pg (ref 26.6–33.0)
MCHC: 32 g/dL (ref 31.5–35.7)
MCV: 93 fL (ref 79–97)
MONOS ABS: 0.4 10*3/uL (ref 0.1–0.9)
Monocytes: 5 %
NEUTROS PCT: 55 %
Neutrophils Absolute: 3.9 10*3/uL (ref 1.4–7.0)
Platelets: 373 10*3/uL (ref 150–450)
RBC: 4.49 x10E6/uL (ref 3.77–5.28)
RDW: 12.8 % (ref 12.3–15.4)
WBC: 7.1 10*3/uL (ref 3.4–10.8)

## 2017-07-21 LAB — LIPID PANEL
CHOL/HDL RATIO: 2.8 ratio (ref 0.0–4.4)
Cholesterol, Total: 167 mg/dL (ref 100–199)
HDL: 59 mg/dL (ref >39)
LDL Calculated: 96 mg/dL (ref 0–99)
TRIGLYCERIDES: 60 mg/dL (ref 0–149)
VLDL Cholesterol Cal: 12 mg/dL (ref 5–40)

## 2017-07-21 LAB — TSH: TSH: 1.21 u[IU]/mL (ref 0.450–4.500)

## 2017-07-23 LAB — H. PYLORI BREATH TEST: H. pylori Breath Test: NOT DETECTED

## 2017-07-23 LAB — C. TRACHOMATIS/N. GONORRHOEAE RNA
C. trachomatis RNA, TMA: NOT DETECTED
N. gonorrhoeae RNA, TMA: NOT DETECTED

## 2017-08-17 ENCOUNTER — Other Ambulatory Visit: Payer: Self-pay | Admitting: Family Medicine

## 2017-08-17 NOTE — Telephone Encounter (Deleted)
Please ask if omeprazole has been helping her symptoms; If so we can re-order and if not we may want to send her to GI

## 2017-08-17 NOTE — Telephone Encounter (Signed)
Please call to see if patient symptoms have improved; if they have not resolved may need to send to GI.

## 2017-08-20 NOTE — Telephone Encounter (Signed)
Called patient. Symptoms have improved, she is doing well. She did not put the request in for the medication to be refilled. She has a few more days left before she runs out. She is going to see if symptoms return after being off of the medication. If they return she will need another refill.+

## 2017-08-24 ENCOUNTER — Telehealth: Payer: Self-pay | Admitting: Family Medicine

## 2017-08-24 NOTE — Telephone Encounter (Signed)
Copied from CRM [id number]. Topic: Quick Communication - See Telephone Encounter >> Aug 24, 2017  9:35 AM [name], [name] wrote: CRM for notification. See Telephone encounter for: 08/24/17. Pt called and states that she finished her stomach medication, omeprazole (PRILOSEC) 20 MG capsule [[id number]][[id number]] two days ago and as soon as she finished her symptoms returned.  Pt states the medication was working really well.  Pt wants to know if she should continue on the medication.  Pt can be reached at [phone]

## 2017-08-27 MED ORDER — OMEPRAZOLE 20 MG PO CPDR: 20.0000 mg | DELAYED_RELEASE_CAPSULE | Freq: Every day | ORAL | 1 refills | Status: DC

## 2017-08-27 NOTE — Telephone Encounter (Signed)
I'll Rx another month or two; would rather not use this long-term; she said her symptoms were gone while taking the medicine; will do one or two more months; if she stops then and symptoms return, let me know; she agrees

## 2017-12-04 ENCOUNTER — Telehealth: Payer: Self-pay

## 2017-12-04 DIAGNOSIS — K219 Gastro-esophageal reflux disease without esophagitis: Secondary | ICD-10-CM

## 2017-12-04 NOTE — Telephone Encounter (Signed)
Left detailed voicemail

## 2017-12-04 NOTE — Telephone Encounter (Signed)
Okay for referral; entered In the meantime, encourage her to avoid triggers  Try to limit or avoid triggers like coffee, caffeinated beverages, onions, chocolate, spicy foods, peppermint, acidic foods like pizza, spaghetti sauce, and orange juice Lose weight if you are overweight or obese Try elevating the head of your bed by placing a small wedge between your mattress and box springs to keep acid in the stomach at night instead of coming up into your esophagus

## 2017-12-04 NOTE — Telephone Encounter (Signed)
Copied from CRM [phone]. Topic: Referral - Request for Referral >> Dec 04, 2017  3:13 PM Terisa Starraylor, Brittany L wrote: Has patient seen PCP for this complaint? Yes *If NO, is insurance requiring patient see PCP for this issue before PCP can refer them? N/A Referral for which specialty: Gastro Preferred provider/office: [facility] @ [phone] Reason for referral: Indigestion

## 2017-12-04 NOTE — Assessment & Plan Note (Signed)
Refer to ortho.

## 2018-01-03 ENCOUNTER — Other Ambulatory Visit: Payer: Self-pay

## 2018-01-03 ENCOUNTER — Encounter (INDEPENDENT_AMBULATORY_CARE_PROVIDER_SITE_OTHER): Payer: Self-pay

## 2018-01-03 ENCOUNTER — Ambulatory Visit: Payer: BLUE CROSS/BLUE SHIELD | Admitting: Gastroenterology

## 2018-01-03 ENCOUNTER — Encounter: Payer: Self-pay | Admitting: Gastroenterology

## 2018-01-03 VITALS — BP 122/80 | HR 87 | Ht 65.0 in | Wt 189.0 lb

## 2018-01-03 DIAGNOSIS — K219 Gastro-esophageal reflux disease without esophagitis: Secondary | ICD-10-CM | POA: Diagnosis not present

## 2018-01-03 MED ORDER — OMEPRAZOLE 20 MG PO CPDR: 20.0000 mg | DELAYED_RELEASE_CAPSULE | Freq: Every day | ORAL | 60 refills | Status: DC

## 2018-01-03 NOTE — Progress Notes (Signed)
Gastroenterology Consultation  Referring Provider:     Kerman PasseyLada, Melinda P, MD Primary Care Physician:  Kerman PasseyLada, Melinda P, MD Primary Gastroenterologist:  Dr. Servando SnareWohl     Reason for Consultation:     GERD        HPI:   Brittany Johns is a 22 y.o. y/o female referred for consultation & management of GERD by Dr. Sherie DonLada, Janit BernMelinda P, MD.  This patient was sent to me for evaluation of chronic heartburn.  The patient has been on an H2 blocker in the past and was switched to omeprazole.  The patient had reported this to be going on for years and saw her PCP back in July reporting that the symptoms had become weekly if not daily.  The patient thinks Zantac may have made it worse and reports that Tums have not eliminated her problem.  The patient also has a family history with her father having reflux for which he takes medication for and is well controlled.  It appears that the patient is only taking 20 mg of Prilosec daily. The patient denies any black stools or bloody stools.  She also denies any dysphasia or any other worry symptoms associated with her heartburn.  There is no family history of any GI issues.  Past Medical History:  Diagnosis Date  . Encounter for birth control pills maintenance 07/13/2015  . Obesity 07/13/2015    Past Surgical History:  Procedure Laterality Date  . WISDOM TOOTH EXTRACTION      Prior to Admission medications   Medication Sig Start Date End Date Taking? Authorizing Provider  norgestimate-ethinyl estradiol (SPRINTEC 28) 0.25-35 MG-MCG tablet Take 1 tablet by mouth daily. 07/20/17   Kerman PasseyLada, Melinda P, MD  omeprazole (PRILOSEC) 20 MG capsule Take 1 capsule (20 mg total) by mouth daily. 08/27/17   Kerman PasseyLada, Melinda P, MD    Family History  Problem Relation Age of Onset  . Hypertension Mother   . Diabetes Maternal Grandmother        BORDERLINE  . Stroke Maternal Grandmother   . Heart disease Maternal Grandfather   . Heart disease Paternal Grandfather      Social  History   Tobacco Use  . Smoking status: Never Smoker  . Smokeless tobacco: Never Used  Substance Use Topics  . Alcohol use: Yes    Alcohol/week: 1.0 standard drinks    Types: 1 Shots of liquor per week    Comment: 1 drink a week  . Drug use: No    Allergies as of 01/03/2018 - Review Complete 07/20/2017  Allergen Reaction Noted  . Flagyl [metronidazole] Nausea Only 07/27/2016  . Penicillins Rash 07/13/2015    Review of Systems:    All systems reviewed and negative except where noted in HPI.   Physical Exam:  There were no vitals taken for this visit. No LMP recorded. General:   Alert,  Well-developed, well-nourished, pleasant and cooperative in NAD Head:  Normocephalic and atraumatic. Eyes:  Sclera clear, no icterus.   Conjunctiva pink. Ears:  Normal auditory acuity. Nose:  No deformity, discharge, or lesions. Mouth:  No deformity or lesions,oropharynx pink & moist. Neck:  Supple; no masses or thyromegaly. Lungs:  Respirations even and unlabored.  Clear throughout to auscultation.   No wheezes, crackles, or rhonchi. No acute distress. Heart:  Regular rate and rhythm; no murmurs, clicks, rubs, or gallops. Abdomen:  Normal bowel sounds.  No bruits.  Soft, non-tender and non-distended without masses, hepatosplenomegaly or hernias noted.  No  guarding or rebound tenderness.  Negative Carnett sign.   Rectal:  Deferred.  Msk:  Symmetrical without gross deformities.  Good, equal movement & strength bilaterally. Pulses:  Normal pulses noted. Extremities:  No clubbing or edema.  No cyanosis. Neurologic:  Alert and oriented x3;  grossly normal neurologically. Skin:  Intact without significant lesions or rashes.  No jaundice. Lymph Nodes:  No significant cervical adenopathy. Psych:  Alert and cooperative. Normal mood and affect.  Imaging Studies: No results found.  Assessment and Plan:   Brittany Johns is a 22 y.o. y/o female who comes today with a history of reflux with  symptoms that are occurring many times per week.  The patient reports that her symptoms are worse with drinking water.  There is no report of any dysphasia black stools or bloody stools.  The patient has responded to a low dose PPI at Prilosec 20 mg a day.  There is no reason to subject the patient to any upper endoscopy at this time since she is not having any worry symptoms nor is her risk for Barrett's esophagus high at her young age.  I would recommend continuing the Prilosec to 20 mg a day and she has been given information on antireflux diets and she has also been told about possible antireflux surgery.  The patient will contact me if her symptoms become worse or she decides she does not want to take the medication any longer.  She has been told the risks of long-term use of PPIs.  The patient states she understands the plan and agrees with it.  Midge Miniumarren Sitlaly Gudiel, MD. Clementeen GrahamFACG    Note: This dictation was prepared with Dragon dictation along with smaller phrase technology. Any transcriptional errors that result from this process are unintentional.

## 2018-03-16 ENCOUNTER — Other Ambulatory Visit: Payer: Self-pay

## 2018-03-16 ENCOUNTER — Emergency Department
Admission: EM | Admit: 2018-03-16 | Discharge: 2018-03-16 | Disposition: A | Discharge status: Home / Self Care | Payer: BLUE CROSS/BLUE SHIELD | Attending: Emergency Medicine | Admitting: Emergency Medicine

## 2018-03-16 DIAGNOSIS — Z79899 Other long term (current) drug therapy: Secondary | ICD-10-CM | POA: Insufficient documentation

## 2018-03-16 DIAGNOSIS — K529 Noninfective gastroenteritis and colitis, unspecified: Secondary | ICD-10-CM | POA: Diagnosis not present

## 2018-03-16 DIAGNOSIS — R252 Cramp and spasm: Secondary | ICD-10-CM | POA: Diagnosis not present

## 2018-03-16 DIAGNOSIS — R112 Nausea with vomiting, unspecified: Secondary | ICD-10-CM | POA: Diagnosis present

## 2018-03-16 LAB — COMPREHENSIVE METABOLIC PANEL
ALT: 21 U/L (ref 0–44)
AST: 16 U/L (ref 15–41)
Albumin: 4.4 g/dL (ref 3.5–5.0)
Alkaline Phosphatase: 42 U/L (ref 38–126)
Anion gap: 12 (ref 5–15)
BUN: 18 mg/dL (ref 6–20)
CO2: 22 mmol/L (ref 22–32)
Calcium: 9.3 mg/dL (ref 8.9–10.3)
Chloride: 105 mmol/L (ref 98–111)
Creatinine, Ser: 0.75 mg/dL (ref 0.44–1.00)
GFR calc Af Amer: >60 mL/min (ref >60)
GFR calc non Af Amer: >60 mL/min (ref >60)
Glucose, Bld: 114 mg/dL — ABNORMAL HIGH (ref 70–99)
Potassium: 3.5 mmol/L (ref 3.5–5.1)
Sodium: 139 mmol/L (ref 135–145)
Total Bilirubin: 0.4 mg/dL (ref 0.3–1.2)
Total Protein: 7.9 g/dL (ref 6.5–8.1)

## 2018-03-16 LAB — CBC
HCT: 43.4 % (ref 36.0–46.0)
Hemoglobin: 14.9 g/dL (ref 12.0–15.0)
MCH: 30.3 pg (ref 26.0–34.0)
MCHC: 34.3 g/dL (ref 30.0–36.0)
MCV: 88.4 fL (ref 80.0–100.0)
Platelets: 343 10*3/uL (ref 150–400)
RBC: 4.91 MIL/uL (ref 3.87–5.11)
RDW: 11.4 % — ABNORMAL LOW (ref 11.5–15.5)
WBC: 11.9 10*3/uL — ABNORMAL HIGH (ref 4.0–10.5)
nRBC: 0 % (ref 0.0–0.2)

## 2018-03-16 LAB — LIPASE, BLOOD: Lipase: 30 U/L (ref 11–51)

## 2018-03-16 LAB — URINALYSIS, COMPLETE (UACMP) WITH MICROSCOPIC
Bilirubin Urine: NEGATIVE
Glucose, UA: NEGATIVE mg/dL
Hgb urine dipstick: NEGATIVE
Ketones, ur: 80 mg/dL — AB
Leukocytes,Ua: NEGATIVE
NITRITE: NEGATIVE
Protein, ur: 30 mg/dL — AB
Specific Gravity, Urine: 1.032 — ABNORMAL HIGH (ref 1.005–1.030)
pH: 5 (ref 5.0–8.0)

## 2018-03-16 LAB — GLUCOSE, CAPILLARY: Glucose-Capillary: 103 mg/dL — ABNORMAL HIGH (ref 70–99)

## 2018-03-16 LAB — POCT PREGNANCY, URINE: Preg Test, Ur: NEGATIVE

## 2018-03-16 MED ORDER — SODIUM CHLORIDE 0.9 % IV BOLUS
1000.0000 mL | Freq: Once | INTRAVENOUS | Status: AC
  Administered 2018-03-16: 1000 mL via INTRAVENOUS

## 2018-03-16 MED ORDER — ONDANSETRON 4 MG PO TBDP
4.0000 mg | ORAL_TABLET | Freq: Once | ORAL | Status: AC | PRN
  Administered 2018-03-16: 4 mg via ORAL

## 2018-03-16 MED ORDER — ONDANSETRON HCL 4 MG/2ML IJ SOLN
4.0000 mg | INTRAMUSCULAR | Status: AC
  Administered 2018-03-16: 4 mg via INTRAVENOUS
  Filled 2018-03-16: qty 2

## 2018-03-16 MED ORDER — ONDANSETRON 4 MG PO TBDP: 4.0000 mg | ORAL_TABLET | Freq: Three times a day (TID) | ORAL | 0 refills | Status: DC | PRN

## 2018-03-16 MED ORDER — ONDANSETRON 4 MG PO TBDP
ORAL_TABLET | ORAL | Status: AC
  Filled 2018-03-16: qty 1

## 2018-03-16 NOTE — ED Provider Notes (Signed)
Dr. York Cerise asked me to reevaluate this patient which I have done, she is well-appearing and is feeling better after fluids and Zofran.  Symptoms are certainly consistent with viral gastroenteritis appropriate discharge at this point with outpatient follow-up as needed, return precautions discussed.  Patient and mother agree with plan   Jene Every, MD 03/16/18 [phone]

## 2018-03-16 NOTE — ED Notes (Signed)
ED Provider at bedside. 

## 2018-03-16 NOTE — ED Triage Notes (Signed)
Pt states abdominal pain with vomiting and diarrhea since Wednesday. Pt is very tearful in triage. Pt states she feels intermittently shaky, skin pwd.

## 2018-03-16 NOTE — ED Notes (Signed)
.   Pt reclined in recliner chair by jann, ed tech. Pt with pwd skin, pt is hyperventilating and crying after blood draw. Mother at side. Emotional reassurance provided. Pt wheeled in recliner to subwait with mother.

## 2018-03-16 NOTE — ED Notes (Signed)
Warm blanket provided.

## 2018-03-19 NOTE — ED Provider Notes (Signed)
[facility] Emergency Department Provider Note  ____________________________________________   First MD Initiated Contact with Patient 03/16/18 [phone]     (approximate)  I have reviewed the triage vital signs and the nursing notes.   HISTORY  Chief Complaint Emesis; Abdominal Pain; and Diarrhea    HPI Brittany Johns is a 23 y.o. female with no contributory past medical history who presents for evaluation of persistent nausea, vomiting, and diarrhea.  The symptoms started a few days ago and have been persistent.  Nothing to makes it better or worse.  She feels like she has a hard time keeping anything down.  Anything that she takes by mouth causes her to have some diarrhea.  The vomiting is mostly resolved but the diarrhea persists.  She has some abdominal cramping that seems to be associated with the diarrhea.  No sharp abdominal pain.  She denies fever/chills, chest pain, shortness of breath.  She has not had similar symptoms in the past and no one else in the family is having the symptoms.  She is more comfortable now after coming to the emergency department.  The symptoms were relatively acute in onset and have been severe.     Past Medical History:  Diagnosis Date  . Encounter for birth control pills maintenance 07/13/2015  . Obesity 07/13/2015    Patient Active Problem List   Diagnosis Date Noted  . Idiopathic urticaria 04/16/2017  . Cervical cancer screening 07/18/2016  . Acid reflux 07/18/2016  . 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 Preventative health care 07/13/2015  . Encounter for birth control pills maintenance 07/13/2015  . Obesity 07/13/2015    Past Surgical History:  Procedure Laterality Date  . WISDOM TOOTH EXTRACTION      Prior to Admission medications   Medication Sig Start Date End Date Taking? Authorizing Provider  norgestimate-ethinyl estradiol (SPRINTEC 28) 0.25-35 MG-MCG tablet Take 1 tablet by mouth daily. 07/20/17   Kerman Passey, MD  omeprazole (PRILOSEC) 20 MG capsule Take 1 capsule (20 mg total) by mouth daily. 01/03/18   Midge Minium, MD  ondansetron (ZOFRAN ODT) 4 MG disintegrating tablet Take 1 tablet (4 mg total) by mouth every 8 (eight) hours as needed. 03/16/18   Jene Every, MD    Allergies Flagyl [metronidazole] and Penicillins  Family History  Problem Relation Age of Onset  . Hypertension Mother   . Diabetes Maternal Grandmother        BORDERLINE  . Stroke Maternal Grandmother   . Heart disease Maternal Grandfather   . Heart disease Paternal Grandfather     Social History Social History   Tobacco Use  . Smoking status: Never Smoker  . Smokeless tobacco: Never Used  Substance Use Topics  . Alcohol use: Yes    Alcohol/week: 1.0 standard drinks    Types: 1 Shots of liquor per week    Comment: 1 drink a week  . Drug use: No    Review of Systems Constitutional: No fever/chills. Some generalized weakness. Eyes: No visual changes. ENT: No sore throat. Cardiovascular: Denies chest pain. Respiratory: Denies shortness of breath. Gastrointestinal: Persistent nausea, vomiting, and diarrhea for several days.  Some abdominal cramping, no sharp abdominal pain.   Genitourinary: Negative for dysuria. Musculoskeletal: Negative for neck pain.  Negative for back pain. Integumentary: Negative for rash. Neurological: Negative for headaches, focal weakness or numbness.   ____________________________________________   PHYSICAL EXAM:  VITAL SIGNS: ED Triage Vitals [03/16/18 0333]  Enc Vitals Group  BP (!) 131/91     Pulse Rate 98     Resp 16     Temp 98.4 F (36.9 C)     Temp Source Oral     SpO2 100 %     Weight 81.6 kg (180 lb)     Height 1.6 m (5\' 3" )     Head Circumference      Peak Flow      Pain Score 6     Pain Loc      Pain Edu?      Excl. in GC?     Constitutional: Alert and oriented.  Generally well-appearing and in no distress. Eyes: Conjunctivae are normal.    Head: Atraumatic. Nose: No congestion/rhinnorhea. Mouth/Throat: Mucous membranes are tacky. Neck: No stridor.  No meningeal signs.   Cardiovascular: Normal rate, regular rhythm. Good peripheral circulation. Grossly normal heart sounds. Respiratory: Normal respiratory effort.  No retractions. Lungs CTAB. Gastrointestinal: Soft and nontender. No distention.  Musculoskeletal: No lower extremity tenderness nor edema. No gross deformities of extremities. Neurologic:  Normal speech and language. No gross focal neurologic deficits are appreciated.  Skin:  Skin is warm, dry and intact. No rash noted. Psychiatric: Mood and affect are normal. Speech and behavior are normal.  ____________________________________________   LABS (all labs ordered are listed, but only abnormal results are displayed)  Labs Reviewed  COMPREHENSIVE METABOLIC PANEL - Abnormal; Notable for the following components:      Result Value   Glucose, Bld 114 (*)    All other components within normal limits  CBC - Abnormal; Notable for the following components:   WBC 11.9 (*)    RDW 11.4 (*)    All other components within normal limits  URINALYSIS, COMPLETE (UACMP) WITH MICROSCOPIC - Abnormal; Notable for the following components:   Color, Urine YELLOW (*)    APPearance CLEAR (*)    Specific Gravity, Urine 1.032 (*)    Ketones, ur 80 (*)    Protein, ur 30 (*)    Bacteria, UA FEW (*)    All other components within normal limits  GLUCOSE, CAPILLARY - Abnormal; Notable for the following components:   Glucose-Capillary 103 (*)    All other components within normal limits  GASTROINTESTINAL PANEL BY PCR, STOOL (REPLACES STOOL CULTURE)  LIPASE, BLOOD  POC URINE PREG, ED  POCT PREGNANCY, URINE   ____________________________________________  EKG  No indication for EKG ____________________________________________  RADIOLOGY   ED MD interpretation: No indication for imaging  Official radiology report(s): No  results found.  ____________________________________________   PROCEDURES   Procedure(s) performed (including Critical Care):  Procedures   ____________________________________________   INITIAL IMPRESSION / MDM / ASSESSMENT AND PLAN / ED COURSE  As part of my medical decision making, I reviewed the following data within the electronic MEDICAL RECORD NUMBER Nursing notes reviewed and incorporated, Labs reviewed , Old chart reviewed, Patient signed out to  and Notes from prior ED visits         Differential diagnosis includes, but is not limited to, viral gastroenteritis, foodborne pathogen or preformed toxin, intra-abdominal infection such as diverticulitis or appendicitis, parasitic infection, other nonspecific viral illness such as influenza, Legionella.  The patient is generally well-appearing and in no distress.  She is clearly volume depleted both based on history and based on the ketones in her urine but her lab work is reassuring including her comprehensive metabolic panel and lipase.  No evidence of infection in her urine and only a very mild  leukocytosis.  Her pricey test is negative.  Vital signs been stable.  She was initially borderline tachycardic but that improved after a liter normal saline and Zofran 4 mg IV.  Her abdomen is completely soft and nontender and nondistended.  I had a discussion with her and her mother about the probability that this is a viral gastroenteritis.  I felt like there is no indication for imaging including an abdominal CT based on her reassuring abdominal exam and they understand and agree that the long-term risks of unnecessary scan and radiation is higher than the very low probability she may have an intra-abdominal infection or other abnormality identifiable on CT scan.  They were comfortable with the plan for Zofran and IV fluids and reassessment.  I told her that if she is able to provide a stool sample that we can check it for pathogens but she has  not had a bowel movement for a few hours and she seemed unsure that she would be able to provide a specimen while here.  I think that is understandable and appropriate and if upon reassessment she is feeling better she can go home for supportive care and outpatient follow-up.  I discussed the case with Dr. Cyril Loosen who will reassess her after her fluids.     ____________________________________________  FINAL CLINICAL IMPRESSION(S) / ED DIAGNOSES  Final diagnoses:  Gastroenteritis     MEDICATIONS GIVEN DURING THIS VISIT:  Medications  ondansetron (ZOFRAN-ODT) disintegrating tablet 4 mg (4 mg Oral Given 03/16/18 0337)  sodium chloride 0.9 % bolus 1,000 mL (0 mLs Intravenous Stopped 03/16/18 0757)  ondansetron (ZOFRAN) injection 4 mg (4 mg Intravenous Given 03/16/18 0647)     ED Discharge Orders         Ordered    ondansetron (ZOFRAN ODT) 4 MG disintegrating tablet  Every 8 hours PRN     03/16/18 0741           Note:  This document was prepared using Dragon voice recognition software and may include unintentional dictation errors.   Loleta Rose, MD 03/19/18 [phone]

## 2018-07-22 ENCOUNTER — Encounter: Payer: Managed Care, Other (non HMO) | Admitting: Family Medicine

## 2019-03-22 ENCOUNTER — Encounter: Payer: Self-pay | Admitting: Emergency Medicine

## 2019-03-22 ENCOUNTER — Other Ambulatory Visit: Payer: Self-pay

## 2019-03-22 ENCOUNTER — Ambulatory Visit
Admission: EM | Admit: 2019-03-22 | Discharge: 2019-03-22 | Disposition: A | Discharge status: Home / Self Care | Payer: No Typology Code available for payment source | Attending: Family Medicine | Admitting: Family Medicine

## 2019-03-22 DIAGNOSIS — R0789 Other chest pain: Secondary | ICD-10-CM

## 2019-03-22 DIAGNOSIS — M25562 Pain in left knee: Secondary | ICD-10-CM

## 2019-03-22 DIAGNOSIS — M546 Pain in thoracic spine: Secondary | ICD-10-CM

## 2019-03-22 HISTORY — DX: Gastro-esophageal reflux disease without esophagitis: K21.9

## 2019-03-22 MED ORDER — MELOXICAM 15 MG PO TABS: 15.0000 mg | ORAL_TABLET | Freq: Every day | ORAL | 0 refills | Status: DC | PRN

## 2019-03-22 MED ORDER — TIZANIDINE HCL 4 MG PO TABS: 4.0000 mg | ORAL_TABLET | Freq: Four times a day (QID) | ORAL | 0 refills | Status: DC | PRN
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
## 2019-03-22 NOTE — Discharge Instructions (Signed)
Rest.  Ice and Heat.  Medication as directed.  Take care  Dr. Jeanet Lupe  
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
## 2019-04-21 ENCOUNTER — Other Ambulatory Visit: Payer: Self-pay | Admitting: Gastroenterology

## 2019-08-29 ENCOUNTER — Other Ambulatory Visit: Payer: Self-pay | Admitting: Gastroenterology

## 2020-08-17 ENCOUNTER — Telehealth: Payer: Self-pay

## 2020-08-17 NOTE — Telephone Encounter (Signed)
Copied from CRM [id number]. Topic: Appointment Scheduling - Scheduling Inquiry for Clinic >> Aug 17, 2020  2:09 PM Daphine Deutscher D wrote: Reason for CRM: Pt called wanting to reestablish being a patient at [facility].  CB#  [phone]

## 2020-08-17 NOTE — Telephone Encounter (Signed)
Lft message that we are not to take new patients at this time. Check back with [place] at the end of the year.

## 2020-10-12 ENCOUNTER — Emergency Department
Admission: EM | Admit: 2020-10-12 | Discharge: 2020-10-12 | Disposition: A | Discharge status: Home / Self Care | Payer: BC Managed Care – PPO | Attending: Emergency Medicine | Admitting: Emergency Medicine

## 2020-10-12 ENCOUNTER — Other Ambulatory Visit: Payer: Self-pay

## 2020-10-12 ENCOUNTER — Encounter: Payer: Self-pay | Admitting: Emergency Medicine

## 2020-10-12 ENCOUNTER — Emergency Department: Payer: BC Managed Care – PPO

## 2020-10-12 DIAGNOSIS — R0600 Dyspnea, unspecified: Secondary | ICD-10-CM | POA: Insufficient documentation

## 2020-10-12 DIAGNOSIS — R42 Dizziness and giddiness: Secondary | ICD-10-CM | POA: Diagnosis not present

## 2020-10-12 DIAGNOSIS — R002 Palpitations: Secondary | ICD-10-CM | POA: Insufficient documentation

## 2020-10-12 LAB — POC URINE PREG, ED: Preg Test, Ur: NEGATIVE

## 2020-10-12 LAB — COMPREHENSIVE METABOLIC PANEL
ALT: 13 U/L (ref 0–44)
AST: 13 U/L — ABNORMAL LOW (ref 15–41)
Albumin: 5.1 g/dL — ABNORMAL HIGH (ref 3.5–5.0)
Alkaline Phosphatase: 50 U/L (ref 38–126)
Anion gap: 10 (ref 5–15)
BUN: 14 mg/dL (ref 6–20)
CO2: 27 mmol/L (ref 22–32)
Calcium: 9.8 mg/dL (ref 8.9–10.3)
Chloride: 103 mmol/L (ref 98–111)
Creatinine, Ser: 0.78 mg/dL (ref 0.44–1.00)
GFR, Estimated: >60 mL/min (ref >60)
Glucose, Bld: 95 mg/dL (ref 70–99)
Potassium: 4.3 mmol/L (ref 3.5–5.1)
Sodium: 140 mmol/L (ref 135–145)
Total Bilirubin: 0.9 mg/dL (ref 0.3–1.2)
Total Protein: 7.5 g/dL (ref 6.5–8.1)

## 2020-10-12 LAB — CBC WITH DIFFERENTIAL/PLATELET
Abs Immature Granulocytes: 0.01 10*3/uL (ref 0.00–0.07)
Basophils Absolute: 0 10*3/uL (ref 0.0–0.1)
Basophils Relative: 1 %
Eosinophils Absolute: 0 10*3/uL (ref 0.0–0.5)
Eosinophils Relative: 0 %
HCT: 40.3 % (ref 36.0–46.0)
Hemoglobin: 14.2 g/dL (ref 12.0–15.0)
Immature Granulocytes: 0 %
Lymphocytes Relative: 32 %
Lymphs Abs: 2.5 10*3/uL (ref 0.7–4.0)
MCH: 32.5 pg (ref 26.0–34.0)
MCHC: 35.2 g/dL (ref 30.0–36.0)
MCV: 92.2 fL (ref 80.0–100.0)
Monocytes Absolute: 0.4 10*3/uL (ref 0.1–1.0)
Monocytes Relative: 5 %
Neutro Abs: 4.8 10*3/uL (ref 1.7–7.7)
Neutrophils Relative %: 62 %
Platelets: 370 10*3/uL (ref 150–400)
RBC: 4.37 MIL/uL (ref 3.87–5.11)
RDW: 11.4 % — ABNORMAL LOW (ref 11.5–15.5)
WBC: 7.7 10*3/uL (ref 4.0–10.5)
nRBC: 0 % (ref 0.0–0.2)

## 2020-10-12 LAB — TSH: TSH: 1.061 u[IU]/mL (ref 0.350–4.500)

## 2020-10-12 LAB — URINALYSIS, COMPLETE (UACMP) WITH MICROSCOPIC
Bacteria, UA: NONE SEEN
Bilirubin Urine: NEGATIVE
Glucose, UA: NEGATIVE mg/dL
Hgb urine dipstick: NEGATIVE
Ketones, ur: NEGATIVE mg/dL
Leukocytes,Ua: NEGATIVE
Nitrite: NEGATIVE
Protein, ur: NEGATIVE mg/dL
Specific Gravity, Urine: 1.023 (ref 1.005–1.030)
pH: 5 (ref 5.0–8.0)

## 2020-10-12 LAB — TROPONIN I (HIGH SENSITIVITY): Troponin I (High Sensitivity): <2 ng/L (ref <18)

## 2020-10-12 NOTE — ED Triage Notes (Signed)
Pt to ED via POV for palpitations, pt states that she occasional has CP with palpitations. Pt states that she has some lightheadedness too when having palpitations. Pt denies shortness of breath but states that she feels like she has to take a deep breath. Pt is speaking in complete sentences at this time. Pt is in NAD.

## 2020-10-12 NOTE — ED Provider Notes (Signed)
[facility]  ____________________________________________   Event Date/Time   First MD Initiated Contact with Patient 10/12/20 1132     (approximate)  I have reviewed the triage vital signs and the nursing notes.   HISTORY  Chief Complaint Palpitations    HPI Brittany Johns is a 25 y.o. female with past medical history of GERD who presents with palpitations.  She notes that over the past year she has had frequent palpitations which are becoming daily over the past month.  She endorses feeling her heart is beating irregular.  Today when she was driving to work she had sudden onset of feeling lightheaded like she is going to pass out which was accompanied by dyspnea and palpitations.  This went on for several minutes and then resolved.  She currently feels back to baseline.  Denies drug or alcohol use or heavy caffeine use.The patient denies hx of prior DVT/PE, unilateral leg pain/swelling, hormone use, recent surgery, hx of cancer, prolonged immobilization, or hemoptysis.           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 Authorizing Provider  hydrOXYzine (ATARAX/VISTARIL) 25 MG tablet Take 1 tablet by mouth every 8 (eight) hours as needed (itching).     [provider]  meloxicam (MOBIC) 15 MG tablet Take 1 tablet (15 mg total) by mouth daily as needed for  pain. 03/22/19   Tommie Sams, DO  omeprazole (PRILOSEC) 20 MG capsule Take 1 capsule (20 mg total) by mouth daily. **PLEASE SCHEDULE A FOLLOW UP APPT** 09/05/19   Midge Minium, MD  tiZANidine (ZANAFLEX) 4 MG tablet Take 1 tablet (4 mg total) by mouth every 6 (six) hours as needed for muscle spasms. 03/22/19   Tommie Sams, DO  norgestimate-ethinyl estradiol (SPRINTEC 28) 0.25-35 MG-MCG tablet Take 1 tablet by mouth daily. 07/20/17 03/22/19  Kerman Passey, MD    Allergies Flagyl [metronidazole] and Penicillins  Family History  Problem Relation Age of Onset   Hypertension Mother    Healthy Father    Diabetes Maternal Grandmother        BORDERLINE   Stroke Maternal Grandmother    Heart disease Maternal Grandfather    Heart disease Paternal Grandfather     Social History Social History   Tobacco Use   Smoking status: Never   Smokeless tobacco: Never  Vaping Use   Vaping Use: Never used  Substance Use Topics   Alcohol use: Yes    Alcohol/week: 1.0 standard drink    Types: 1 Shots of liquor per week    Comment: 1 drink a week   Drug use: No    Review of Systems   Review of Systems  Constitutional:  Negative for chills and fever.  Respiratory:  Positive for shortness of breath. Negative for chest tightness.   Cardiovascular:  Positive for palpitations. Negative for chest pain and leg  swelling.  Gastrointestinal:  Negative for abdominal pain, nausea and vomiting.  All other systems reviewed and are negative.  Physical Exam Updated Vital Signs BP 113/83 (BP Location: Right Arm)   Pulse 86   Temp 98.2 F (36.8 C) (Oral)   Resp 17   Ht 5\' 5"  (1.651 m)   Wt 81.6 kg   SpO2 99%   BMI 29.95 kg/m   Physical Exam Vitals and nursing note reviewed.  Constitutional:      General: She is not in acute distress.    Appearance: Normal appearance.  HENT:     Head: Normocephalic and atraumatic.  Eyes:     General: No scleral icterus.    Conjunctiva/sclera: Conjunctivae normal.   Cardiovascular:     Rate and Rhythm: Normal rate and regular rhythm.     Heart sounds: No murmur heard. Pulmonary:     Effort: Pulmonary effort is normal. No respiratory distress.     Breath sounds: No stridor.  Musculoskeletal:        General: No deformity or signs of injury.     Cervical back: Normal range of motion.     Right lower leg: No edema.     Left lower leg: No edema.  Skin:    General: Skin is dry.     Coloration: Skin is not jaundiced or pale.  Neurological:     General: No focal deficit present.     Mental Status: She is alert and oriented to person, place, and time. Mental status is at baseline.  Psychiatric:        Mood and Affect: Mood normal.        Behavior: Behavior normal.     LABS (all labs ordered are listed, but only abnormal results are displayed)  Labs Reviewed  COMPREHENSIVE METABOLIC PANEL - Abnormal; Notable for the following components:      Result Value   Albumin 5.1 (*)    AST 13 (*)    All other components within normal limits  CBC WITH DIFFERENTIAL/PLATELET - Abnormal; Notable for the following components:   RDW 11.4 (*)    All other components within normal limits  URINALYSIS, COMPLETE (UACMP) WITH MICROSCOPIC - Abnormal; Notable for the following components:   Color, Urine YELLOW (*)    APPearance HAZY (*)    All other components within normal limits  TSH  POC URINE PREG, ED  TROPONIN I (HIGH SENSITIVITY)   ____________________________________________  EKG  Sinus arrhythmia, normal axis, normal intervals, no evidence of QT prolongation, Brugada, WPW or ARVD ____________________________________________  RADIOLOGY I, , personally viewed and evaluated these images (plain radiographs) as part of my medical decision making, as well as reviewing the written report by the radiologist.  ED MD interpretation:  n/a    ____________________________________________   PROCEDURES  Procedure(s) performed (including  Critical Care):  Procedures   ____________________________________________   INITIAL IMPRESSION / ASSESSMENT AND PLAN / ED COURSE     25 year old female presents with palpitations and an episode of lightheadedness today.  On exam she is very well-appearing, heart rate is regular.  Her EKG shows sinus arrhythmia without any concerning findings such as WPW, Brugada, ARVD or other interval allergy.  Blood work obtained from triage is all reassuring, no anemia, electrolyte abnormality and TSH is normal.  hCG negative.  She is appropriate for discharge at this time.  I advised that she follow-up with cardiology given the chronicity of her symptoms she may need to have a Holter  monitor or other prolonged monitoring to rule out significant arrhythmia.  Patient was comfortable with the plan.      ____________________________________________   FINAL CLINICAL IMPRESSION(S) / ED DIAGNOSES  Final diagnoses:  Palpitations     ED Discharge Orders     None        Note:  This document was prepared using Dragon voice recognition software and may include unintentional dictation errors.    Georga Hacking, MD 10/12/20 1229

## 2020-10-12 NOTE — Discharge Instructions (Signed)
Your blood work including your blood counts, electrolytes and thyroid function were all normal today.  Your EKG was also normal.  It is possible that you having an intermittent arrhythmia causing your symptoms.  Given the duration of symptoms, please follow-up with cardiology as you may need to have prolonged monitoring.

## 2020-10-12 NOTE — ED Provider Notes (Signed)
Emergency Medicine Provider Triage Evaluation Note  Brittany Johns , a 25 y.o. female  was evaluated in triage.  Pt complains of palpitations, shortness of breath associated with palpitations and chest tightness.  No fever or chills.  Some lightheadedness..  Review of Systems  Positive: Palpitations, chest tightness, dizziness Negative: Vomiting, diarrhea, fever, chills  Physical Exam  BP (!) 136/97 (BP Location: Right Arm)   Pulse 93   Temp 98.4 F (36.9 C) (Oral)   Resp 15   SpO2 100%  Gen:   Awake, no distress   Resp:  Normal effort  MSK:   Moves extremities without difficulty  Other:  Heart sounds are normal, appears to be normal rate and rhythm, no murmur rub or gallop  Medical Decision Making  Medically screening exam initiated at 10:43 AM.  Appropriate orders placed.  Brittany Johns was informed that the remainder of the evaluation will be completed by another provider, this initial triage assessment does not replace that evaluation, and the importance of remaining in the ED until their evaluation is complete.     Faythe Ghee, PA-C 10/12/20 1045    Chesley Noon, MD 10/12/20 1535

## 2020-11-27 ENCOUNTER — Ambulatory Visit (HOSPITAL_COMMUNITY)
Admission: RE | Admit: 2020-11-27 | Discharge: 2020-11-27 | Disposition: A | Discharge status: Home / Self Care | Payer: BC Managed Care – PPO | Attending: Psychiatry | Admitting: Psychiatry

## 2020-11-27 NOTE — H&P (Signed)
Behavioral Health Medical Screening Exam  Brittany Johns is an 25 y.o. female who presented to [facility] with her husband voluntarily for assessment of anxiety and depression. Patient reports past psychiatric history of anxiety and depression since college in which she has managed independently. Says her symptoms have been on a steady incline over the years and recently began outpatient therapy as result, just finished 3rd session. States symptoms peaked after returning from business trip to [place] last week.  Says while she was away she was unable to relax without her husband there after hoping to "reset". 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 Typically works 50 hours/wk.   She endorses poor quality of sleep resulting in her feeling exhausted and not rested despite amount of sleep, anhedonia, feelings of guilt and worthlessness, poor appetite with feelings of nausea, low energy and concentration 'constantly forgetting', and passive suicidal ideations with thoughts of death but denies intent, plan, or feeling like she wanted to die. She currently lives in her parent's home with her husband where she identifies them as a "strong" support system. Says mother has history of depression. Denies any history of psychiatric hospitalization or psychotropic medications.   She denies any active suicidal or homicidal ideations, auditory or visual hallucinations. States she believes she may need medications due to persistent symptoms. Provider discussed possibly increasing current services and local prescribers in her area. She denies any safety concerns and feels she is able to return home and remain safe. Patient provided resources for [facility], [facility], and [facility]).   Total Time spent with patient: 20 minutes  Psychiatric Specialty Exam: Physical Exam Vitals and nursing  note reviewed.  Constitutional:      Appearance: Normal appearance.  HENT:     Head: Normocephalic.     Nose: Nose normal.     Mouth/Throat:     Mouth: Mucous membranes are moist.     Pharynx: Oropharynx is clear.  Eyes:     Pupils: Pupils are equal, round, and reactive to light.  Cardiovascular:     Rate and Rhythm: Normal rate.  Pulmonary:     Effort: Pulmonary effort is normal.  Musculoskeletal:        General: Normal range of motion.     Cervical back: Normal range of motion.  Skin:    General: Skin is warm and dry.  Neurological:     Mental Status: She is alert and oriented to person, place, and time. Mental status is at baseline.  Psychiatric:        Attention and Perception: Attention and perception normal.        Mood and Affect: Affect is tearful.        Speech: Speech normal.        Behavior: Behavior is withdrawn. Behavior is cooperative.        Thought Content: Thought content is not paranoid or delusional. Thought content does not include homicidal or suicidal ideation. Thought content does not include homicidal or suicidal plan.        Cognition and Memory: Cognition and memory normal.        Judgment: Judgment normal.   Review of Systems  Constitutional:  Positive for appetite change.  Psychiatric/Behavioral:  Positive for decreased concentration and sleep disturbance. Negative for self-injury and suicidal ideas.   All other systems reviewed and are negative. Blood pressure 115/78, pulse 86, temperature 98.4  F (36.9 C), temperature source Oral, resp. rate 18, SpO2 100 %.There is no height or weight on file to calculate BMI. General Appearance: Casual Eye Contact:  Good Speech:  Clear and Coherent Volume:  Normal Mood:  Anxious and Depressed Affect:  Congruent and Tearful Thought Process:  Coherent and Goal Directed Orientation:  Full (Time, Place, and Person) Thought Content:  WDL and Logical Suicidal Thoughts:  No Homicidal Thoughts:  No Memory:   Immediate;   Good Recent;   Good Judgement:  Good Insight:  Good Psychomotor Activity:  Normal Concentration: Concentration: Good and Attention Span: Good Recall:  Good Fund of Knowledge:Good Language: Good Akathisia:  NA Handed:   AIMS (if indicated):    Assets:  Communication Skills Desire for Improvement Financial Resources/Insurance Housing Intimacy Leisure Time Physical Health Resilience Social Support Talents/Skills Transportation Vocational/Educational Sleep:     Musculoskeletal: Strength & Muscle Tone: within normal limits Gait & Station: normal Patient leans: N/A  Blood pressure 115/78, pulse 86, temperature 98.4 F (36.9 C), temperature source Oral, resp. rate 18, SpO2 100 %.  Recommendations: Based on my evaluation the patient does not appear to have an emergency medical condition. Patient given local outpatient providers for medication management per her request. Patient denies any safety concerns and contracts for safety. Plans to follow-up with therapist to increase sessions. Crisis information given in case symptoms increase or patient begins to feel unsafe   Loletta Parish, NP 11/27/2020, 12:56 PM

## 2020-12-02 ENCOUNTER — Ambulatory Visit (INDEPENDENT_AMBULATORY_CARE_PROVIDER_SITE_OTHER): Payer: BC Managed Care – PPO | Admitting: Gastroenterology

## 2020-12-02 VITALS — BP 137/86 | HR 96 | Temp 98.3°F | Wt 194.0 lb

## 2020-12-02 DIAGNOSIS — K582 Mixed irritable bowel syndrome: Secondary | ICD-10-CM

## 2020-12-02 DIAGNOSIS — K219 Gastro-esophageal reflux disease without esophagitis: Secondary | ICD-10-CM | POA: Diagnosis not present

## 2020-12-02 MED ORDER — DICYCLOMINE HCL 10 MG PO CAPS: 10.0000 mg | ORAL_CAPSULE | Freq: Three times a day (TID) | ORAL | 2 refills | Status: AC

## 2020-12-02 MED ORDER — PANTOPRAZOLE SODIUM 40 MG PO TBEC: 40.0000 mg | DELAYED_RELEASE_TABLET | Freq: Every day | ORAL | 2 refills | Status: AC

## 2020-12-02 MED ORDER — DESIPRAMINE HCL 25 MG PO TABS: 25.0000 mg | ORAL_TABLET | Freq: Every day | ORAL | 2 refills | Status: AC

## 2020-12-02 NOTE — Progress Notes (Signed)
Primary Care Physician: Pcp, No  Primary Gastroenterologist:  Dr. Midge Miniumarren Alice [place]side  Chief Complaint  Patient presents with   Abdominal Pain    Epigastric    Bloated   Gastroesophageal Reflux   Constipation   Diarrhea    HPI: Yehuda BuddCatherine M Bergey is a 25 y.o. female here with report of abdominal pain.  The patient had seen me in the past in December 2019 for GERD with the patient being told to continue her omeprazole and was given information on a GERD diet.  The patient now comes today with a report of alternating diarrhea and constipation with abdominal discomfort and bloating with abdominal pain reported to be spasm like.  She also reports that she has a lot of nausea and has heartburn despite taking omeprazole.  She states she is only taking the 20 mg a day.  The patient denies any lack stools or bloody stools.  She also denies that the diarrhea wakes her up from the middle of the night.  She is tearful during the encounter.  There is no family history of any colon polyps or colon cancer that she or her mother can recall.  There is no report of hematemesis.  She reports that she does not sleep well at night.    The patient is concerned that this may all be caused by her gallbladder becauseHer mother had her gallbladder removed in the past  Past Medical History:  Diagnosis Date   Encounter for birth control pills maintenance 07/13/2015   GERD (gastroesophageal reflux disease)    Obesity 07/13/2015    Current Outpatient Medications  Medication Sig Dispense Refill   hydrOXYzine (ATARAX/VISTARIL) 25 MG tablet Take 1 tablet by mouth every 8 (eight) hours as needed (itching).      omeprazole (PRILOSEC) 20 MG capsule Take 1 capsule (20 mg total) by mouth daily. **PLEASE SCHEDULE A FOLLOW UP APPT** 30 capsule 3   No current facility-administered medications for this visit.    Allergies as of 12/02/2020 - Review Complete 12/02/2020  Allergen Reaction Noted   Flagyl [metronidazole]  Nausea Only 07/27/2016   Penicillins Rash 07/13/2015    ROS:  General: Negative for anorexia, weight loss, fever, chills, fatigue, weakness. ENT: Negative for hoarseness, difficulty swallowing , nasal congestion. CV: Negative for chest pain, angina, palpitations, dyspnea on exertion, peripheral edema.  Respiratory: Negative for dyspnea at rest, dyspnea on exertion, cough, sputum, wheezing.  GI: See history of present illness. GU:  Negative for dysuria, hematuria, urinary incontinence, urinary frequency, nocturnal urination.  Endo: Negative for unusual weight change.    Physical Examination:   BP 137/86   Pulse 96   Temp 98.3 F (36.8 C) (Oral)   Wt 194 lb (88 kg)   BMI 32.28 kg/m   General: Well-nourished, well-developed in no acute distress.  Eyes: No icterus. Conjunctivae pink. Lungs: Clear to auscultation bilaterally. Non-labored. Heart: Regular rate and rhythm, no murmurs rubs or gallops.  Abdomen: Bowel sounds are normal, nontender, nondistended, no hepatosplenomegaly or masses, no abdominal bruits or hernia , no rebound or guarding.   Extremities: No lower extremity edema. No clubbing or deformities. Neuro: Alert and oriented x 3.  Grossly intact. Skin: Warm and dry, no jaundice.   Psych: Alert and cooperative, normal mood and affect.  Labs:    Imaging Studies: No results found.  Assessment and Plan:   Yehuda BuddCatherine M Alonzo is a 25 y.o. y/o female who comes in today with alternating diarrhea and constipation with bloating and  abdominal pain without any worrisome symptoms and no unexplained weight loss. The patient has been explained the pathophysiology of irritable bowel syndrome and has been told to start Citrucel every day.  The patient will also be started on desipramine before she goes to sleep and on Protonix 40 mg twice a day to see if her nausea improves.  If it does that she will back off to one today.  She will also be started on dicyclomine 10 mg 3 times a day  with the option of increasing the dose if it does not work for her.  She has also been told that removing the gallbladder with irritable bowel syndrome can cause her symptoms to be worse. The patient has been explained the plan and agrees with it.     Midge Minium, MD. Clementeen Graham    Note: This dictation was prepared with Dragon dictation along with smaller phrase technology. Any transcriptional errors that result from this process are unintentional.

## 2020-12-03 ENCOUNTER — Encounter: Payer: Self-pay | Admitting: Gastroenterology

## 2021-09-07 ENCOUNTER — Other Ambulatory Visit: Payer: Self-pay

## 2021-09-07 ENCOUNTER — Emergency Department
Admission: EM | Admit: 2021-09-07 | Discharge: 2021-09-07 | Disposition: A | Discharge status: Home / Self Care | Payer: BC Managed Care – PPO | Attending: Emergency Medicine | Admitting: Emergency Medicine

## 2021-09-07 DIAGNOSIS — L299 Pruritus, unspecified: Secondary | ICD-10-CM | POA: Diagnosis not present

## 2021-09-07 HISTORY — DX: Post-traumatic stress disorder, unspecified: F43.10

## 2021-09-07 HISTORY — DX: Major depressive disorder, single episode, unspecified: F32.9

## 2021-09-07 HISTORY — DX: Anxiety disorder, unspecified: F41.9

## 2021-09-07 LAB — COMPREHENSIVE METABOLIC PANEL
ALT: 35 U/L (ref 0–44)
AST: 29 U/L (ref 15–41)
Albumin: 4.5 g/dL (ref 3.5–5.0)
Alkaline Phosphatase: 58 U/L (ref 38–126)
Anion gap: 8 (ref 5–15)
BUN: 17 mg/dL (ref 6–20)
CO2: 25 mmol/L (ref 22–32)
Calcium: 9.1 mg/dL (ref 8.9–10.3)
Chloride: 106 mmol/L (ref 98–111)
Creatinine, Ser: 0.75 mg/dL (ref 0.44–1.00)
GFR, Estimated: >60 mL/min (ref >60)
Glucose, Bld: 83 mg/dL (ref 70–99)
Potassium: 3.9 mmol/L (ref 3.5–5.1)
Sodium: 139 mmol/L (ref 135–145)
Total Bilirubin: 1 mg/dL (ref 0.3–1.2)
Total Protein: 7.4 g/dL (ref 6.5–8.1)

## 2021-09-07 LAB — URINALYSIS, ROUTINE W REFLEX MICROSCOPIC
Bilirubin Urine: NEGATIVE
Glucose, UA: NEGATIVE mg/dL
Hgb urine dipstick: NEGATIVE
Ketones, ur: 20 mg/dL — AB
Leukocytes,Ua: NEGATIVE
Nitrite: NEGATIVE
Protein, ur: NEGATIVE mg/dL
Specific Gravity, Urine: 1.026 (ref 1.005–1.030)
pH: 5 (ref 5.0–8.0)

## 2021-09-07 LAB — CBC WITH DIFFERENTIAL/PLATELET
Abs Immature Granulocytes: 0.02 10*3/uL (ref 0.00–0.07)
Basophils Absolute: 0.1 10*3/uL (ref 0.0–0.1)
Basophils Relative: 1 %
Eosinophils Absolute: 0 10*3/uL (ref 0.0–0.5)
Eosinophils Relative: 1 %
HCT: 39.2 % (ref 36.0–46.0)
Hemoglobin: 13.1 g/dL (ref 12.0–15.0)
Immature Granulocytes: 0 %
Lymphocytes Relative: 39 %
Lymphs Abs: 2.8 10*3/uL (ref 0.7–4.0)
MCH: 30.7 pg (ref 26.0–34.0)
MCHC: 33.4 g/dL (ref 30.0–36.0)
MCV: 91.8 fL (ref 80.0–100.0)
Monocytes Absolute: 0.6 10*3/uL (ref 0.1–1.0)
Monocytes Relative: 8 %
Neutro Abs: 3.8 10*3/uL (ref 1.7–7.7)
Neutrophils Relative %: 51 %
Platelets: 352 10*3/uL (ref 150–400)
RBC: 4.27 MIL/uL (ref 3.87–5.11)
RDW: 12.1 % (ref 11.5–15.5)
WBC: 7.3 10*3/uL (ref 4.0–10.5)
nRBC: 0 % (ref 0.0–0.2)

## 2021-09-07 LAB — POC URINE PREG, ED: Preg Test, Ur: NEGATIVE

## 2021-09-07 MED ORDER — DEXAMETHASONE SODIUM PHOSPHATE 10 MG/ML IJ SOLN
10.0000 mg | Freq: Once | INTRAMUSCULAR | Status: AC
  Administered 2021-09-07: 10 mg via INTRAVENOUS

## 2021-09-07 MED ORDER — FAMOTIDINE 20 MG PO TABS
40.0000 mg | ORAL_TABLET | Freq: Once | ORAL | Status: AC
  Administered 2021-09-07: 40 mg via ORAL
  Filled 2021-09-07: qty 2

## 2021-09-07 MED ORDER — DIPHENHYDRAMINE HCL 50 MG/ML IJ SOLN
50.0000 mg | Freq: Once | INTRAMUSCULAR | Status: DC
  Filled 2021-09-07: qty 1

## 2021-09-07 MED ORDER — DEXAMETHASONE SODIUM PHOSPHATE 10 MG/ML IJ SOLN
10.0000 mg | Freq: Once | INTRAMUSCULAR | Status: DC
  Filled 2021-09-07: qty 1

## 2021-09-07 MED ORDER — DIPHENHYDRAMINE HCL 50 MG/ML IJ SOLN
50.0000 mg | Freq: Once | INTRAMUSCULAR | Status: AC
  Administered 2021-09-07: 50 mg via INTRAVENOUS

## 2021-09-07 NOTE — ED Triage Notes (Signed)
Pt presents to ER with c/o allergic rxn to lamictal.  Pt states she started the medicine appx 3 weeks ago, with symptoms such as hives all over, and some new itching and pain in her vaginal area.  Pt states symptoms started appx 2 weeks ago.  Pt states she has not been taking the medicine anymore, but is still having some itching all over, and in her vaginal area.  Pt is otherwise A&O x4 at this time in NAD in triage.

## 2021-09-07 NOTE — ED Provider Notes (Signed)
[facility] Provider Note  Patient Contact: 9:17 PM (approximate)   History   Allergic Reaction   HPI  Brittany Johns is a 26 y.o. female presents to the emergency department with pruritus.  Patient states that she noticed pruritus 1 day after starting Lamictal.  She states that she developed hives and has since stopped medication but still has some residual itching.  She states that her hives have improved significantly.  She denies chest pain, chest tightness, shortness of breath, vomiting or syncope.      Physical Exam   Triage Vital Signs: ED Triage Vitals  Enc Vitals Group     BP 09/07/21 1953 (!) 126/96     Pulse Rate 09/07/21 1953 (!) 105     Resp 09/07/21 1953 18     Temp 09/07/21 1953 98 F (36.7 C)     Temp Source 09/07/21 1953 Oral     SpO2 09/07/21 1953 97 %     Weight 09/07/21 1956 200 lb (90.7 kg)     Height 09/07/21 1956 5\' 4"  (1.626 m)     Head Circumference --      Peak Flow --      Pain Score 09/07/21 1956 0     Pain Loc --      Pain Edu? --      Excl. in GC? --     Most recent vital signs: Vitals:   09/07/21 1953  BP: (!) 126/96  Pulse: (!) 105  Resp: 18  Temp: 98 F (36.7 C)  SpO2: 97%     General: Alert and in no acute distress. Eyes:  PERRL. EOMI. Head: No acute traumatic findings ENT:      Nose: No congestion/rhinnorhea.      Mouth/Throat: Mucous membranes are moist. Neck: No stridor. No cervical spine tenderness to palpation. Cardiovascular:  Good peripheral perfusion Respiratory: Normal respiratory effort without tachypnea or retractions. Lungs CTAB. Good air entry to the bases with no decreased or absent breath sounds. Gastrointestinal: Bowel sounds 4 quadrants. Soft and nontender to palpation. No guarding or rigidity. No palpable masses. No distention. No CVA tenderness. Musculoskeletal: Full range of motion to all extremities.  Neurologic:  No gross focal neurologic deficits are appreciated.   Skin: No hives.     ED Results / Procedures / Treatments   Labs (all labs ordered are listed, but only abnormal results are displayed) Labs Reviewed  URINALYSIS, ROUTINE W REFLEX MICROSCOPIC - Abnormal; Notable for the following components:      Result Value   Color, Urine YELLOW (*)    APPearance HAZY (*)    Ketones, ur 20 (*)    All other components within normal limits  POC URINE PREG, ED - Normal  CBC WITH DIFFERENTIAL/PLATELET  COMPREHENSIVE METABOLIC PANEL        PROCEDURES:  Critical Care performed: No  Procedures   MEDICATIONS ORDERED IN ED: Medications  famotidine (PEPCID) tablet 40 mg (40 mg Oral Given 09/07/21 2144)  dexamethasone (DECADRON) injection 10 mg (10 mg Intravenous Given 09/07/21 2143)  diphenhydrAMINE (BENADRYL) injection 50 mg (50 mg Intravenous Given 09/07/21 2143)     IMPRESSION / MDM / ASSESSMENT AND PLAN / ED COURSE  I reviewed the triage vital signs and the nursing notes.                              Assessment and plan:  Pruritus 26 year old female presents  to the emergency department with concern for pruritus secondary to Lamictal usage.  CBC and CMP reassuring.  Patient reported that pruritus resolved after Decadron, Benadryl and Pepcid.  Recommended continuing Benadryl and Pepcid at home until symptoms completely resolve.     FINAL CLINICAL IMPRESSION(S) / ED DIAGNOSES   Final diagnoses:  Pruritus     Rx / DC Orders   ED Discharge Orders     None        Note:  This document was prepared using Dragon voice recognition software and may include unintentional dictation errors.   Pia Mau [place], PA-C 09/07/21 2323    Merwyn Katos, MD 09/08/21 [id number]

## 2021-09-07 NOTE — Discharge Instructions (Signed)
You can continue taking 25 to 50 mg of Benadryl every 6 hours until symptoms resolve. You can take 40 mg of Pepcid once daily.

## 2022-08-22 ENCOUNTER — Encounter (HOSPITAL_COMMUNITY): Payer: Self-pay

## 2022-08-22 ENCOUNTER — Ambulatory Visit
Admission: RE | Admit: 2022-08-22 | Discharge: 2022-08-22 | Disposition: A | Discharge status: Home / Self Care | Payer: Managed Care, Other (non HMO) | Source: Ambulatory Visit | Attending: Family Medicine | Admitting: Family Medicine

## 2022-08-22 ENCOUNTER — Ambulatory Visit (HOSPITAL_COMMUNITY): Admission: RE | Admit: 2022-08-22 | Payer: BC Managed Care – PPO | Source: Ambulatory Visit

## 2022-08-22 ENCOUNTER — Other Ambulatory Visit: Payer: Self-pay | Admitting: Family Medicine

## 2022-08-22 DIAGNOSIS — R197 Diarrhea, unspecified: Secondary | ICD-10-CM

## 2022-08-22 DIAGNOSIS — M549 Dorsalgia, unspecified: Secondary | ICD-10-CM

## 2022-08-22 DIAGNOSIS — R1032 Left lower quadrant pain: Secondary | ICD-10-CM

## 2022-08-22 MED ORDER — IOPAMIDOL (ISOVUE-300) INJECTION 61%
100.0000 mL | Freq: Once | INTRAVENOUS | Status: AC | PRN
  Administered 2022-08-22: 100 mL via INTRAVENOUS

## 2023-08-09 ENCOUNTER — Encounter: Payer: Self-pay | Admitting: Family Medicine

## 2023-08-09 ENCOUNTER — Ambulatory Visit
Admission: RE | Admit: 2023-08-09 | Discharge: 2023-08-09 | Disposition: A | Discharge status: Home / Self Care | Payer: PRIVATE HEALTH INSURANCE | Source: Ambulatory Visit | Attending: Family Medicine | Admitting: Family Medicine

## 2023-08-09 ENCOUNTER — Other Ambulatory Visit: Payer: Self-pay | Admitting: Family Medicine

## 2023-08-09 DIAGNOSIS — R1032 Left lower quadrant pain: Secondary | ICD-10-CM | POA: Insufficient documentation

## 2023-08-09 DIAGNOSIS — R110 Nausea: Secondary | ICD-10-CM | POA: Insufficient documentation

## 2023-08-09 NOTE — Progress Notes (Signed)
 History of Present Illness:   Brittany Johns is a 28 y.o. female here for   Verbally consented to the use of AI for note-taking.   Chief Complaint  Patient presents with  . Abdominal Pain    1 week     History of Present Illness Brittany Johns is a 28 year old female who presents with left lower abdominal pain.  She has been experiencing left lower abdominal pain radiating to her left lower back. The pain is described as a constant pressure with a burning sensation and occasional sharp, radiating pain. Symptoms have been present for the past few days, with a notable increase in severity. Initially, discomfort was noticed about a week ago when leaning against something. Sitting with her legs pulled up exacerbates the pain, which she describes as a squeezing pressure.  She mentions experiencing nausea and feeling faint yesterday.  No fever, vomiting, or urinary symptoms, but she notes random chills and diarrhea yesterday. She reports feeling weak. She confirms drinking plenty of fluids.  Her menstrual cycle has been irregular for the past two months, with her last period being atypically short and occurring on different days than usual. She has been bleeding for the past couple of days, but it has already stopped. She mentions having unprotected sex, raising concerns about a possible pregnancy.  She recently started taking gabapentin, but no new foods or other medications have been introduced. She denies any significant changes in her diet or fluid intake.   Past Medical History:   Past Medical History:  Diagnosis Date  . Anxiety   . Depression     Past Surgical History:   Past Surgical History:  Procedure Laterality Date  . WISDOM TEETH      Allergies:   Allergies  Allergen Reactions  . Abilify [Aripiprazole] Other (See Comments)  . Metronidazole  Nausea  . Penicillins Hives    Current Medications:   Prior to Admission medications  Medication Sig Taking?  Last Dose  buPROPion (WELLBUTRIN XL) 300 MG XL tablet Take 300 mg by mouth once daily Yes Taking  hydrOXYzine (ATARAX) 25 MG tablet Take 25 mg by mouth 3 (three) times daily as needed for Itching Yes Taking  propranoloL (INDERAL) 10 MG tablet Take 10 mg by mouth as needed Yes Taking    Family History:   Family History  Problem Relation Name Age of Onset  . High blood pressure (Hypertension) Mother    . Heart murmur Father      Social History:   Social History   Socioeconomic History  . Marital status: Married  Tobacco Use  . Smoking status: Never  . Smokeless tobacco: Never  Vaping Use  . Vaping status: Never Used  Substance and Sexual Activity  . Alcohol use: Yes  . Drug use: Never  . Sexual activity: Yes    Partners: Male   Social Drivers of Health   Financial Resource Strain: Low Risk  (08/09/2023)   Overall Financial Resource Strain (CARDIA)   . Difficulty of Paying Living Expenses: Not hard at all  Food Insecurity: No Food Insecurity (08/09/2023)   Hunger Vital Sign   . Worried About Programme researcher, broadcasting/film/video in the Last Year: Never true   . Ran Out of Food in the Last Year: Never true  Transportation Needs: No Transportation Needs (08/09/2023)   PRAPARE - Transportation   . Lack of Transportation (Medical): No   . Lack of Transportation (Non-Medical): No  Physical Activity: Insufficiently Active (07/20/2017)   Received  from [facility]   Exercise Vital Sign   . Days of Exercise per Week: 2 days   . Minutes of Exercise per Session: 40 min  Stress: No Stress Concern Present (07/20/2017)   Received from [facility] - Occupational Stress Questionnaire   . Feeling of Stress : Only a little  Social Connections: Somewhat Isolated (07/20/2017)   Received from [facility]   Social Connection and Isolation Panel   . Frequency of Communication with Friends and Family: More than three times a week   . Frequency of Social Gatherings with  Friends and Family: More than three times a week   . Attends Religious Services: 1 to 4 times per year   . Active Member of Clubs or Organizations: No   . Attends Banker Meetings: Never   . Marital Status: Never married  Housing Stability: Low Risk  (08/09/2023)   Housing Stability Vital Sign   . Unable to Pay for Housing in the Last Year: No   . Number of Times Moved in the Last Year: 0   . Homeless in the Last Year: No    Review of Systems:   A 10 point review of systems is negative, except for the pertinent positives and negatives detailed in the HPI.  Vitals:   Vitals:   08/09/23 0925  BP: 120/80  Pulse: 79  Temp: 36.9 C (98.5 F)  SpO2: 99%  Weight: 94.5 kg (208 lb 6.4 oz)  Height: 162.6 cm (5' 4)     Body mass index is 35.77 kg/m.  Physical Exam:   Physical Exam Vitals and nursing note reviewed.  Constitutional:      General: She is not in acute distress.    Appearance: Normal appearance. She is not ill-appearing, toxic-appearing or diaphoretic.  HENT:     Head: Normocephalic and atraumatic.     Right Ear: External ear normal.     Left Ear: External ear normal.  Eyes:     General:        Right eye: No discharge.        Left eye: No discharge.     Conjunctiva/sclera: Conjunctivae normal.  Cardiovascular:     Rate and Rhythm: Normal rate and regular rhythm.     Pulses: Normal pulses.     Heart sounds: Normal heart sounds. No murmur heard.    No friction rub. No gallop.  Pulmonary:     Effort: Pulmonary effort is normal. No respiratory distress.     Breath sounds: Normal breath sounds. No stridor. No wheezing, rhonchi or rales.  Chest:     Chest wall: No tenderness.  Abdominal:     General: There is no distension.     Tenderness: There is abdominal tenderness in the left lower quadrant. There is no guarding or rebound.   Skin:    General: Skin is warm and dry.     Capillary Refill: Capillary refill takes less than 2 seconds.   Neurological:     Mental Status: She is alert.     Assessment and Plan:   Results for orders placed or performed in visit on 08/09/23  CBC w/auto Differential (5 Part)  Result Value Ref Range   WBC (White Blood Cell Count) 7.9 4.1 - 10.2 10^3/uL   RBC (Red Blood Cell Count) 4.42 4.04 - 5.48 10^6/uL   Hemoglobin 13.6 12.0 - 15.0 gm/dL   Hematocrit 60.1 64.9 - 47.0 %   MCV (  Mean Corpuscular Volume) 90.0 80.0 - 100.0 fl   MCH (Mean Corpuscular Hemoglobin) 30.8 27.0 - 31.2 pg   MCHC (Mean Corpuscular Hemoglobin Concentration) 34.2 32.0 - 36.0 gm/dL   Platelet Count 634 849 - 450 10^3/uL   RDW-CV (Red Cell Distribution Width) 12.0 11.6 - 14.8 %   MPV (Mean Platelet Volume) 9.4 9.4 - 12.4 fl   Neutrophils 4.78 1.50 - 7.80 10^3/uL   Lymphocytes 2.51 1.00 - 3.60 10^3/uL   Monocytes 0.49 0.00 - 1.50 10^3/uL   Eosinophils 0.06 0.00 - 0.55 10^3/uL   Basophils 0.04 0.00 - 0.09 10^3/uL   Neutrophil % 60.4 32.0 - 70.0 %   Lymphocyte % 31.7 10.0 - 50.0 %   Monocyte % 6.2 4.0 - 13.0 %   Eosinophil % 0.8 (L) 1.0 - 5.0 %   Basophil% 0.5 0.0 - 2.0 %   Immature Granulocyte % 0.4 <=0.7 %   Immature Granulocyte Count 0.03 <=0.06 10^3/L    Diagnoses and all orders for this visit:  Abdominal pain, LLQ (left lower quadrant) -     US  FDC pelvic complete; Future -     US  FDC pelvic transvaginal; Future -     Cancel: Beta HCG, Quantitative, Blood -     CBC w/auto Differential (5 Part) -     Comprehensive Metabolic Panel (CMP) -     Beta HCG, Quantitative, Blood  Nausea -     US  FDC pelvic complete; Future -     US  FDC pelvic transvaginal; Future -     Cancel: Beta HCG, Quantitative, Blood -     CBC w/auto Differential (5 Part) -     Comprehensive Metabolic Panel (CMP) -     Beta HCG, Quantitative, Blood  Diarrhea, unspecified type -     Cancel: Beta HCG, Quantitative, Blood -     CBC w/auto Differential (5 Part) -     Comprehensive Metabolic Panel (CMP) -     Beta HCG, Quantitative,  Blood  Other fatigue -     Cancel: Beta HCG, Quantitative, Blood -     CBC w/auto Differential (5 Part) -     Comprehensive Metabolic Panel (CMP) -     Beta HCG, Quantitative, Blood   Assessment & Plan Left lower abdominal pain Presents with left lower abdominal pain radiating to the left lower back and hip, described as a constant pressure with intermittent sharp, burning sensations. The pain has been present for a few days, with increased severity recently. Differential diagnosis includes ovarian cyst, ectopic pregnancy, or other gynecological issues. Menstrual irregularities and recent unprotected intercourse raise the possibility of an ectopic pregnancy. An ultrasound is necessary to evaluate the ovaries and rule out ectopic pregnancy. - Order pregnancy test (HCG) stat - Order complete blood count - Order abdominal and transvaginal ultrasound - Advise hydration and monitor for worsening symptoms such as heart palpitations, fevers, chills, or increased pain  Menstrual irregularities Reports irregular menstrual cycles over the past two months, with periods lasting only two days and occurring on different days than usual. This irregularity, combined with the abdominal pain, raises concern for possible gynecological issues such as an ovarian cyst or ectopic pregnancy. - Evaluate with pregnancy test and ultrasound to rule out ectopic pregnancy or other gynecological issues  Nausea and diarrhea Experienced nausea and diarrhea yesterday, with no vomiting. Symptoms were accompanied by weakness and a faint feeling. Reports eating at some place with a little bit contaminated food. Symptoms were accompanied by weakness and  a faint feeling. - Ensure adequate hydration  Follow-up Labs and an ultrasound will be performed to evaluate the cause of her symptoms. Results are expected later today. Ultrasound authorization and scheduling will be coordinated with the hospital. - Perform labs and ultrasound  at the hospital - Contact insurance for ultrasound authorization - Schedule ultrasound at the hospital or alternative location if necessary - Review lab and ultrasound results after lunchtime - she will call the office with any new or worsening symptoms.      This note has been created using automated tools and reviewed for accuracy by provider.  Patient received an After Visit Summary    Attestation Statement:   I personally performed the service, non-incident to. (WP)   GLENDA MACARIO HADDOCK, NP

## 2023-08-10 ENCOUNTER — Ambulatory Visit: Payer: PRIVATE HEALTH INSURANCE

## 2023-08-10 ENCOUNTER — Emergency Department
Admission: RE | Admit: 2023-08-10 | Discharge: 2023-08-10 | Disposition: A | Discharge status: Home / Self Care | Payer: PRIVATE HEALTH INSURANCE | Source: Ambulatory Visit | Attending: Family Medicine | Admitting: Family Medicine

## 2023-08-10 ENCOUNTER — Other Ambulatory Visit: Payer: Self-pay

## 2023-08-10 ENCOUNTER — Other Ambulatory Visit: Payer: Self-pay | Admitting: Family Medicine

## 2023-08-10 ENCOUNTER — Encounter: Payer: Self-pay | Admitting: Emergency Medicine

## 2023-08-10 ENCOUNTER — Emergency Department
Admission: EM | Admit: 2023-08-10 | Discharge: 2023-08-10 | Disposition: A | Discharge status: Home / Self Care | Payer: PRIVATE HEALTH INSURANCE | Attending: Emergency Medicine | Admitting: Emergency Medicine

## 2023-08-10 DIAGNOSIS — R1032 Left lower quadrant pain: Secondary | ICD-10-CM | POA: Diagnosis present

## 2023-08-10 DIAGNOSIS — R197 Diarrhea, unspecified: Secondary | ICD-10-CM

## 2023-08-10 DIAGNOSIS — R110 Nausea: Secondary | ICD-10-CM

## 2023-08-10 LAB — CBC WITH DIFFERENTIAL/PLATELET
Abs Immature Granulocytes: 0.02 K/uL (ref 0.00–0.07)
Basophils Absolute: 0 K/uL (ref 0.0–0.1)
Basophils Relative: 0 %
Eosinophils Absolute: 0.1 K/uL (ref 0.0–0.5)
Eosinophils Relative: 1 %
HCT: 41.7 % (ref 36.0–46.0)
Hemoglobin: 14 g/dL (ref 12.0–15.0)
Immature Granulocytes: 0 %
Lymphocytes Relative: 52 %
Lymphs Abs: 4.6 K/uL — ABNORMAL HIGH (ref 0.7–4.0)
MCH: 30.4 pg (ref 26.0–34.0)
MCHC: 33.6 g/dL (ref 30.0–36.0)
MCV: 90.7 fL (ref 80.0–100.0)
Monocytes Absolute: 0.5 K/uL (ref 0.1–1.0)
Monocytes Relative: 6 %
Neutro Abs: 3.7 K/uL (ref 1.7–7.7)
Neutrophils Relative %: 41 %
Platelets: 383 K/uL (ref 150–400)
RBC: 4.6 MIL/uL (ref 3.87–5.11)
RDW: 11.9 % (ref 11.5–15.5)
WBC: 9 K/uL (ref 4.0–10.5)
nRBC: 0 % (ref 0.0–0.2)

## 2023-08-10 LAB — COMPREHENSIVE METABOLIC PANEL WITH GFR
ALT: 14 U/L (ref 0–44)
AST: 14 U/L — ABNORMAL LOW (ref 15–41)
Albumin: 4.1 g/dL (ref 3.5–5.0)
Alkaline Phosphatase: 47 U/L (ref 38–126)
Anion gap: 12 (ref 5–15)
BUN: 16 mg/dL (ref 6–20)
CO2: 24 mmol/L (ref 22–32)
Calcium: 9.5 mg/dL (ref 8.9–10.3)
Chloride: 105 mmol/L (ref 98–111)
Creatinine, Ser: 0.96 mg/dL (ref 0.44–1.00)
GFR, Estimated: >60 mL/min (ref >60)
Glucose, Bld: 107 mg/dL — ABNORMAL HIGH (ref 70–99)
Potassium: 4 mmol/L (ref 3.5–5.1)
Sodium: 141 mmol/L (ref 135–145)
Total Bilirubin: 0.5 mg/dL (ref 0.0–1.2)
Total Protein: 7.1 g/dL (ref 6.5–8.1)

## 2023-08-10 LAB — TROPONIN I (HIGH SENSITIVITY): Troponin I (High Sensitivity): <2 ng/L (ref <18)

## 2023-08-10 MED ORDER — NITROFURANTOIN MONOHYD MACRO 100 MG PO CAPS: 100.0000 mg | ORAL_CAPSULE | Freq: Two times a day (BID) | ORAL | 0 refills | Status: AC

## 2023-08-10 MED ORDER — IOHEXOL 300 MG/ML  SOLN
100.0000 mL | Freq: Once | INTRAMUSCULAR | Status: AC | PRN
  Administered 2023-08-10: 100 mL via INTRAVENOUS

## 2023-08-10 MED ORDER — NITROFURANTOIN MONOHYD MACRO 100 MG PO CAPS
100.0000 mg | ORAL_CAPSULE | Freq: Once | ORAL | Status: AC
  Administered 2023-08-10: 100 mg via ORAL
  Filled 2023-08-10: qty 1

## 2023-08-10 NOTE — ED Triage Notes (Signed)
 Pt to triage via w/c with no distress noted; pt reports left lower abd pain radiating into chest and back; seen by PCP yesterday and dx with ovarian growth

## 2023-08-10 NOTE — ED Provider Notes (Signed)
 [facility] Provider Note   Event Date/Time   First MD Initiated Contact with Patient 08/10/23 0246     (approximate) History  Abdominal Pain  HPI Brittany Johns is a 28 y.o. female with a past medical history of PTSD, major depressive disorder with anxiety, obesity, and reflux disease who presents complaining of lower abdominal pain radiating to the chest and back.  Patient was seen at the PCP yesterday diagnosed with an ovarian growth however patient denies knowing what this growth was.  Patient states that she feels a sharp pain that is worsened by moving the left leg and radiates to different locations intermittently including the right lower quadrant, left upper quadrant, the left chest, and the right chest.  Patient denies any other exacerbating or relieving factors for this pain. ROS: Patient currently denies any vision changes, tinnitus, difficulty speaking, facial droop, sore throat, chest pain, shortness of breath, nausea/vomiting/diarrhea, dysuria, or weakness/numbness/paresthesias in any extremity   Physical Exam  Triage Vital Signs: ED Triage Vitals  Encounter Vitals Group     BP 08/10/23 0056 (!) 146/98     Girls Systolic BP Percentile --      Girls Diastolic BP Percentile --      Boys Systolic BP Percentile --      Boys Diastolic BP Percentile --      Pulse Rate 08/10/23 0056 88     Resp 08/10/23 0056 18     Temp 08/10/23 0056 98.7 F (37.1 C)     Temp Source 08/10/23 0056 Oral     SpO2 08/10/23 0056 96 %     Weight 08/10/23 0055 208 lb (94.3 kg)     Height 08/10/23 0055 5' 3 (1.6 m)     Head Circumference --      Peak Flow --      Pain Score 08/10/23 0054 8     Pain Loc --      Pain Education --      Exclude from Growth Chart --    Most recent vital signs: Vitals:   08/10/23 0056 08/10/23 0331  BP: (!) 146/98 111/80  Pulse: 88 65  Resp: 18 18  Temp: 98.7 F (37.1 C) 97.9 F (36.6 C)  SpO2: 96% 96%   General: Awake,  oriented x4. CV:  Good peripheral perfusion. Resp:  Normal effort. Abd:  No distention.  Nontender to palpation Other:  Middle-aged obese Caucasian female resting comfortably in no acute distress ED Results / Procedures / Treatments  Labs (all labs ordered are listed, but only abnormal results are displayed) Labs Reviewed  CBC WITH DIFFERENTIAL/PLATELET - Abnormal; Notable for the following components:      Result Value   Lymphs Abs 4.6 (*)    All other components within normal limits  COMPREHENSIVE METABOLIC PANEL WITH GFR - Abnormal; Notable for the following components:   Glucose, Bld 107 (*)    AST 14 (*)    All other components within normal limits  URINALYSIS, ROUTINE W REFLEX MICROSCOPIC  POC URINE PREG, ED  TROPONIN I (HIGH SENSITIVITY)  TROPONIN I (HIGH SENSITIVITY)   EKG ED ECG REPORT I, Artist MARLA Kerns, the attending physician, personally viewed and interpreted this ECG. Date: 08/10/2023 EKG Time: 0058 Rate: 81 Rhythm: normal sinus rhythm QRS Axis: normal Intervals: normal ST/T Wave abnormalities: normal Narrative Interpretation: no evidence of acute ischemia RADIOLOGY ED MD interpretation: 2 view chest x-ray interpreted by me shows no evidence of acute abnormalities including no pneumonia,  pneumothorax, or widened mediastinum Pelvic ultrasound shows likely dermoid cyst on the right ovary - All radiology independently interpreted and agree with radiology assessment Official radiology report(s): DG Chest 2 View Result Date: 08/10/2023 EXAM: 2 VIEW(S) XRAY OF THE CHEST 08/10/2023 01:11:53 AM COMPARISON: 10/12/2020 CLINICAL HISTORY: CP. Pt to triage via w/c with no distress noted; pt reports left lower abd pain radiating into chest and back; seen by PCP yesterday and dx with ovarian growth FINDINGS: LUNGS AND PLEURA: No focal pulmonary opacity. No pulmonary edema. No pleural effusion. No pneumothorax. HEART AND MEDIASTINUM: No acute abnormality of the cardiac and  mediastinal silhouettes. BONES AND SOFT TISSUES: No acute osseous abnormality. IMPRESSION: 1. No acute cardiopulmonary pathology. Electronically signed by: Pinkie Pebbles MD 08/10/2023 01:12 AM EDT RP Workstation: [id number]   US  PELVIC COMPLETE WITH TRANSVAGINAL Result Date: 08/09/2023 CLINICAL DATA:  Left lower quadrant abdominal and pelvic pain with nausea for the past week. EXAM: TRANSABDOMINAL AND TRANSVAGINAL ULTRASOUND OF PELVIS TECHNIQUE: Both transabdominal and transvaginal ultrasound examinations of the pelvis were performed. Transabdominal technique was performed for global imaging of the pelvis including uterus, ovaries, adnexal regions, and pelvic cul-de-sac. It was necessary to proceed with endovaginal exam following the transabdominal exam to visualize the ovaries and better visualize the uterus and endometrium. COMPARISON:  Abdomen and pelvis CT dated 08/22/2022. FINDINGS: Uterus Measurements: 6.7 x 4.1 x 3.2 cm = volume: 45 mL. No fibroids or other mass visualized. Endometrium Thickness: 2.4 mm.  No focal abnormality visualized. Right ovary Measurements: 3.7 x 3.4 x 2.5 cm = volume: 16 mL. 2.7 x 2.6 x 2.0 cm oval, mildly irregular and heterogeneous mass-like area in the right ovary with echogenic and hypoechoic components. No internal blood flow with color Doppler. No corresponding abnormality on the previous CT with a simple appearing cyst seen in that ovary on 08/22/2022. Left ovary Measurements: 3.1 x 2.2 x 2.0 cm = volume: 7 mL. Normal appearance/no adnexal mass. Other findings No abnormal free fluid. IMPRESSION: 1. 2.7 x 2.6 x 2.0 cm oval, mildly irregular and heterogeneous mass-like area in the right ovary with features suggesting a possible interval developing dermoid. A corpus luteum is less likely. A follow-up pelvic ultrasound in 6-12 weeks is recommended to assess for resolution. 2. Otherwise, unremarkable examination. Electronically Signed   By: Elspeth Bathe M.D.   On: 08/09/2023  13:24   PROCEDURES: Critical Care performed: No Procedures MEDICATIONS ORDERED IN ED: Medications  nitrofurantoin (macrocrystal-monohydrate) (MACROBID) capsule 100 mg (100 mg Oral Given 08/10/23 0332)   IMPRESSION / MDM / ASSESSMENT AND PLAN / ED COURSE  I reviewed the triage vital signs and the nursing notes.                             The patient is on the cardiac monitor to evaluate for evidence of arrhythmia and/or significant heart rate changes. Patient's presentation is most consistent with acute presentation with potential threat to life or bodily function. Patient's symptoms not typical for emergent causes of abdominal pain such as, but not limited to, appendicitis, abdominal aortic aneurysm, surgical biliary disease, pancreatitis, SBO, mesenteric ischemia, serious intra-abdominal bacterial illness. Presentation also not typical of gynecologic emergencies such as TOA, Ovarian Torsion, PID. Not Ectopic. Doubt atypical ACS.  Pt tolerating PO. Disposition: Patient will be discharged with strict return precautions and follow up with primary MD within 12-24 hours for further evaluation. Patient understands that this still may have an early presentation of  an emergent medical condition such as appendicitis that will require a recheck.   FINAL CLINICAL IMPRESSION(S) / ED DIAGNOSES   Final diagnoses:  Left lower quadrant abdominal pain   Rx / DC Orders   ED Discharge Orders          Ordered    nitrofurantoin, macrocrystal-monohydrate, (MACROBID) 100 MG capsule  2 times daily        08/10/23 9686           Note:  This document was prepared using Dragon voice recognition software and may include unintentional dictation errors.   Labrandon Knoch K, MD 08/10/23 [phone]

## 2023-08-15 ENCOUNTER — Telehealth: Payer: Self-pay

## 2023-08-15 DIAGNOSIS — Z0000 Encounter for general adult medical examination without abnormal findings: Secondary | ICD-10-CM

## 2023-09-04 IMAGING — CR DG CHEST 2V
2 series · 2 of 2 positions shown · non-contrast
Comparison: None.

CLINICAL DATA: Chest pain, shortness of breath, palpitations

EXAM:
CHEST - 2 VIEW

[chest pa]
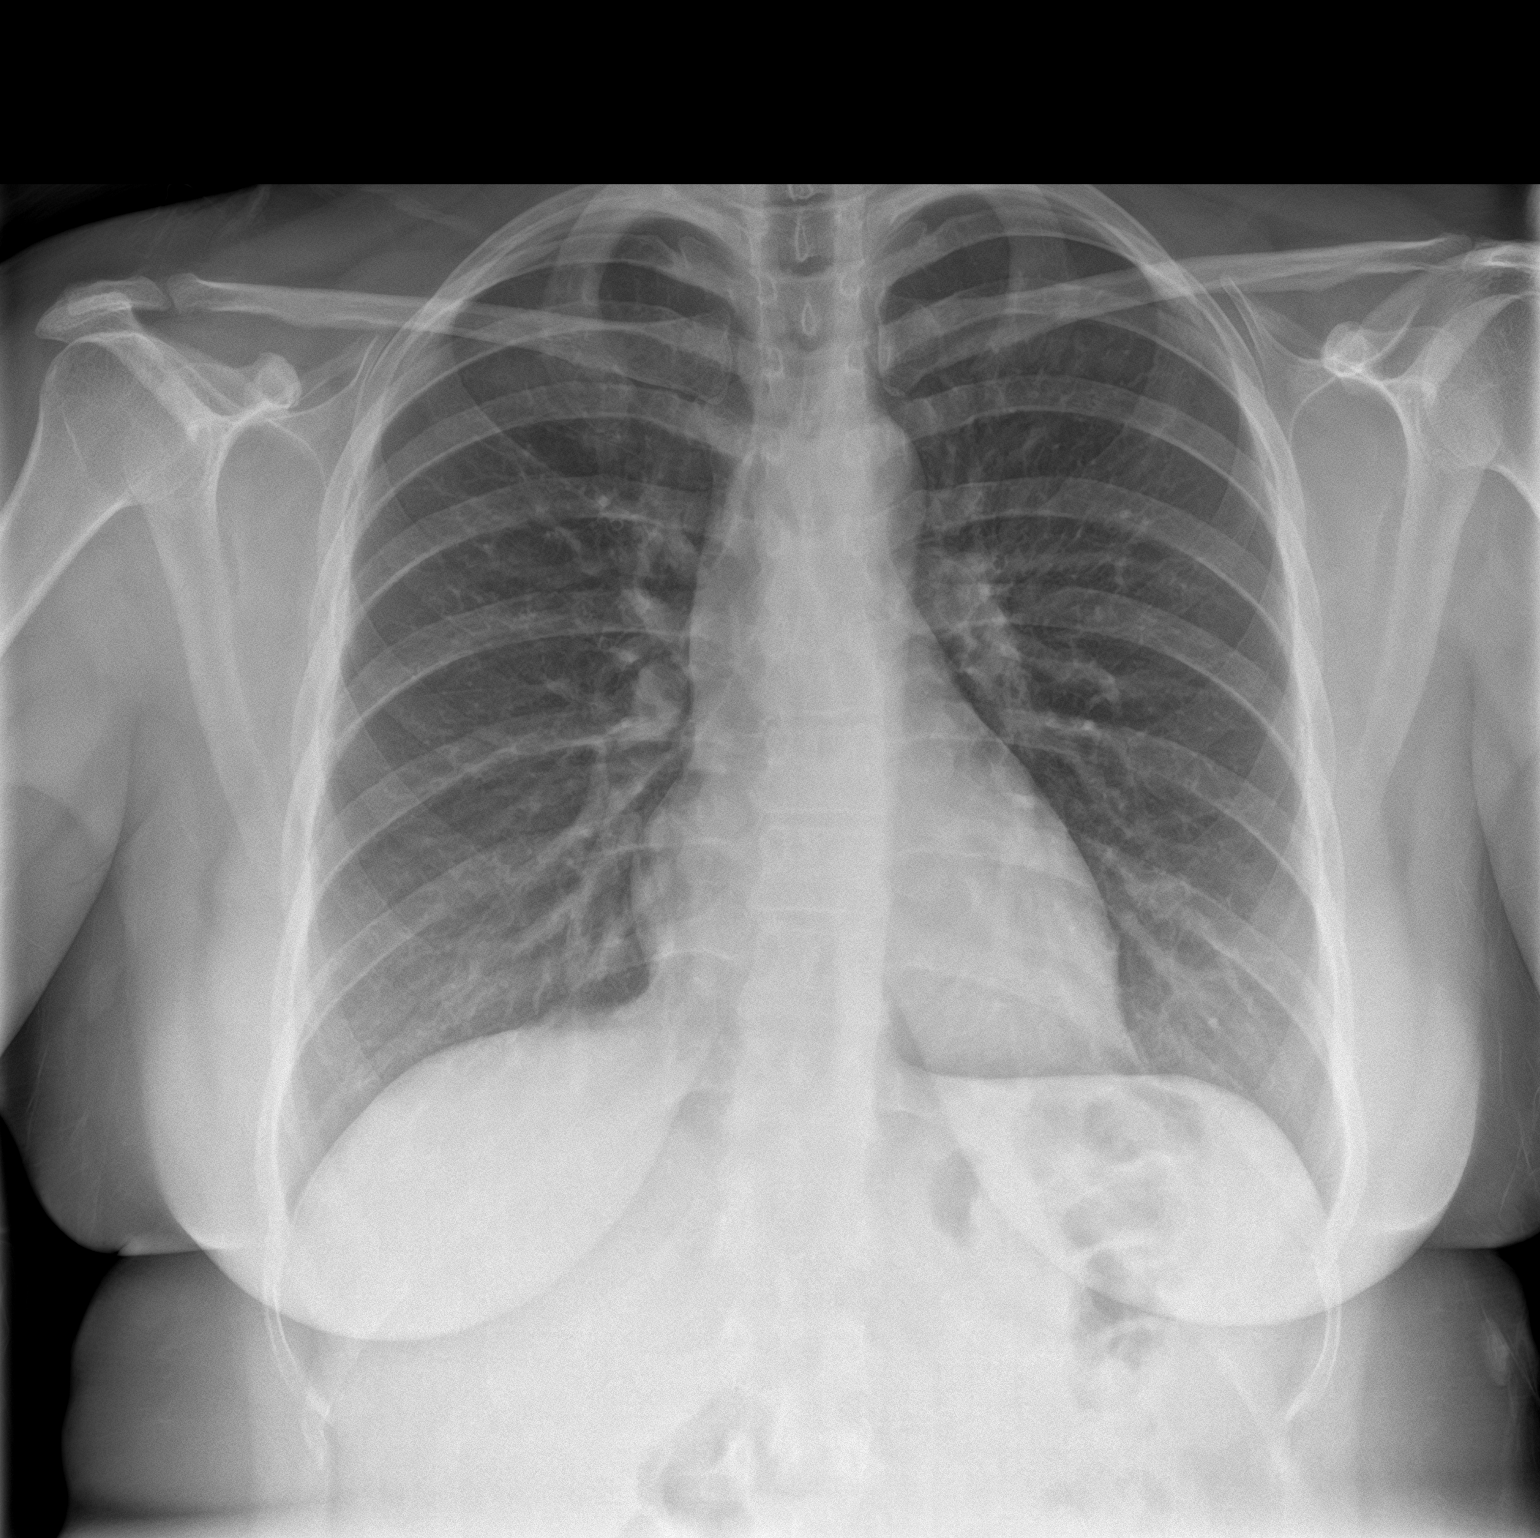

[chest lat]
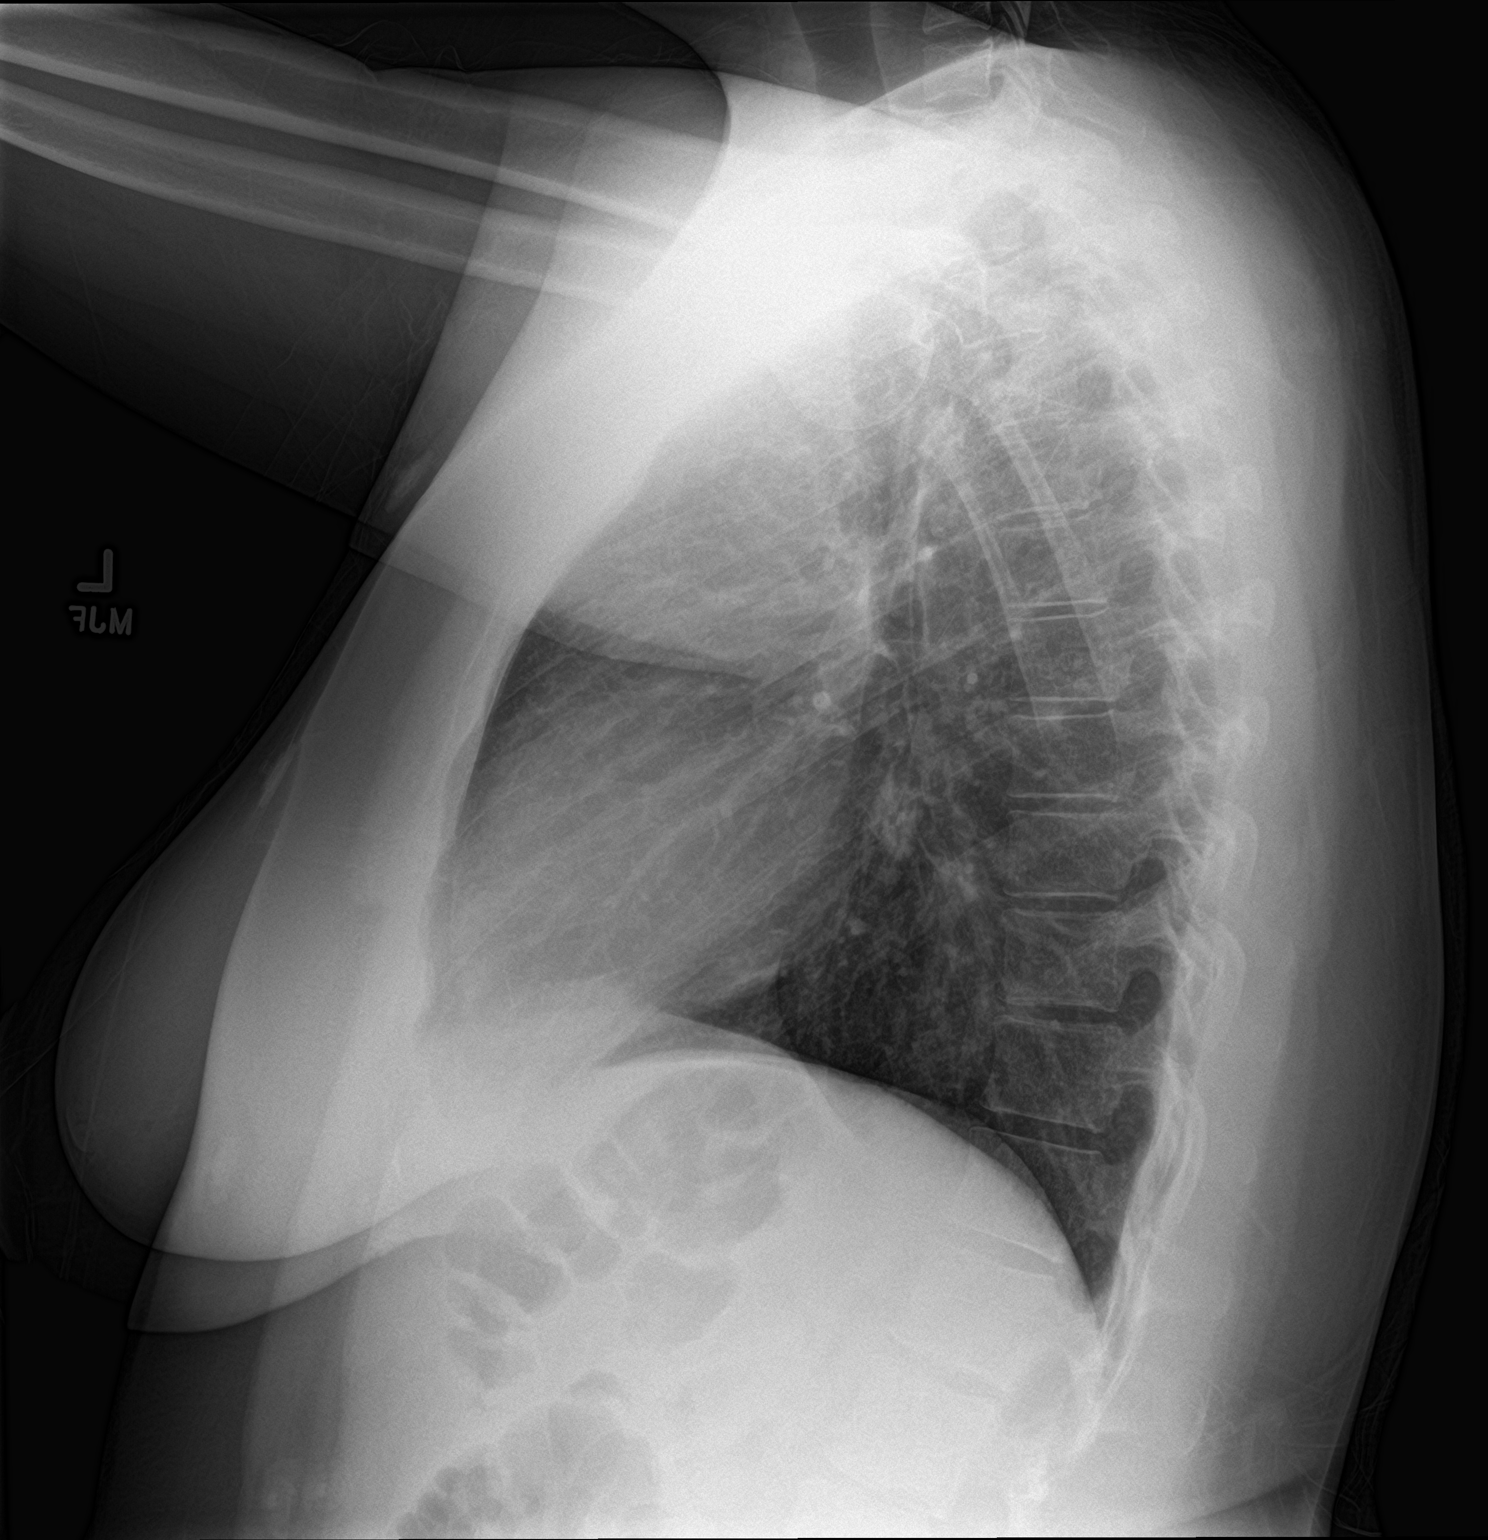

[2 of 2 positions shown; findings below may reference images not displayed]

FINDINGS: The cardiomediastinal silhouette is normal.

There is no focal consolidation or pulmonary edema. There is no
pleural effusion or pneumothorax.

The bones are normal.
IMPRESSION: No radiographic evidence of acute cardiopulmonary process.

## 2023-11-06 ENCOUNTER — Encounter: Payer: Self-pay | Admitting: Anesthesiology

## 2023-11-09 ENCOUNTER — Ambulatory Visit: Admit: 2023-11-09 | Payer: PRIVATE HEALTH INSURANCE | Admitting: Gastroenterology

## 2023-11-09 SURGERY — COLONOSCOPY
Anesthesia: General
